# Patient Record
Sex: Male | Born: 1960 | Race: White | Hispanic: No | Smoking: Current every day smoker
Health system: Southern US, Community
[De-identification: ages and names within clinical notes are randomized; demographics above are authoritative.]

## PROBLEM LIST (undated history)

## (undated) DIAGNOSIS — I219 Acute myocardial infarction, unspecified: Secondary | ICD-10-CM

## (undated) DIAGNOSIS — I10 Essential (primary) hypertension: Secondary | ICD-10-CM

## (undated) DIAGNOSIS — G4733 Obstructive sleep apnea (adult) (pediatric): Secondary | ICD-10-CM

## (undated) DIAGNOSIS — G709 Myoneural disorder, unspecified: Secondary | ICD-10-CM

## (undated) DIAGNOSIS — E039 Hypothyroidism, unspecified: Secondary | ICD-10-CM

## (undated) DIAGNOSIS — J302 Other seasonal allergic rhinitis: Secondary | ICD-10-CM

## (undated) DIAGNOSIS — J189 Pneumonia, unspecified organism: Secondary | ICD-10-CM

## (undated) DIAGNOSIS — R011 Cardiac murmur, unspecified: Secondary | ICD-10-CM

## (undated) DIAGNOSIS — M1990 Unspecified osteoarthritis, unspecified site: Secondary | ICD-10-CM

## (undated) DIAGNOSIS — E119 Type 2 diabetes mellitus without complications: Secondary | ICD-10-CM

## (undated) DIAGNOSIS — E7800 Pure hypercholesterolemia, unspecified: Secondary | ICD-10-CM

## (undated) DIAGNOSIS — C801 Malignant (primary) neoplasm, unspecified: Secondary | ICD-10-CM

## (undated) DIAGNOSIS — Z21 Asymptomatic human immunodeficiency virus [HIV] infection status: Secondary | ICD-10-CM

## (undated) DIAGNOSIS — B20 Human immunodeficiency virus [HIV] disease: Secondary | ICD-10-CM

## (undated) DIAGNOSIS — I2510 Atherosclerotic heart disease of native coronary artery without angina pectoris: Secondary | ICD-10-CM

## (undated) HISTORY — PX: KNEE SURGERY: SHX244

## (undated) HISTORY — PX: CARPAL TUNNEL RELEASE: SHX101

## (undated) HISTORY — DX: Obstructive sleep apnea (adult) (pediatric): G47.33

## (undated) HISTORY — DX: Hypothyroidism, unspecified: E03.9

## (undated) HISTORY — PX: UMBILICAL HERNIA REPAIR: SHX196

## (undated) HISTORY — DX: Human immunodeficiency virus (HIV) disease: B20

## (undated) HISTORY — DX: Other seasonal allergic rhinitis: J30.2

## (undated) HISTORY — DX: Essential (primary) hypertension: I10

## (undated) HISTORY — PX: OTHER SURGICAL HISTORY: SHX169

## (undated) HISTORY — DX: Asymptomatic human immunodeficiency virus (hiv) infection status: Z21

## (undated) HISTORY — DX: Pure hypercholesterolemia, unspecified: E78.00

## (undated) HISTORY — PX: BACK SURGERY: SHX140

## (undated) HISTORY — DX: Type 2 diabetes mellitus without complications: E11.9

## (undated) HISTORY — PX: APPENDECTOMY: SHX54

## (undated) HISTORY — DX: Acute myocardial infarction, unspecified: I21.9

## (undated) HISTORY — PX: ROTATOR CUFF REPAIR: SHX139

---

## 2001-03-24 ENCOUNTER — Observation Stay (HOSPITAL_COMMUNITY): Admission: RE | Admit: 2001-03-24 | Discharge: 2001-03-25 | Payer: Self-pay | Admitting: Orthopedic Surgery

## 2001-11-24 ENCOUNTER — Observation Stay (HOSPITAL_COMMUNITY): Admission: RE | Admit: 2001-11-24 | Discharge: 2001-11-25 | Payer: Self-pay | Admitting: Orthopedic Surgery

## 2003-05-05 ENCOUNTER — Emergency Department (HOSPITAL_COMMUNITY): Admission: EM | Admit: 2003-05-05 | Discharge: 2003-05-05 | Payer: Self-pay | Admitting: Emergency Medicine

## 2003-06-27 ENCOUNTER — Ambulatory Visit (HOSPITAL_BASED_OUTPATIENT_CLINIC_OR_DEPARTMENT_OTHER): Admission: RE | Admit: 2003-06-27 | Discharge: 2003-06-27 | Payer: Self-pay | Admitting: Orthopedic Surgery

## 2003-06-27 ENCOUNTER — Ambulatory Visit (HOSPITAL_COMMUNITY): Admission: RE | Admit: 2003-06-27 | Discharge: 2003-06-27 | Payer: Self-pay | Admitting: Orthopedic Surgery

## 2005-11-02 ENCOUNTER — Ambulatory Visit (HOSPITAL_COMMUNITY): Admission: RE | Admit: 2005-11-02 | Discharge: 2005-11-03 | Payer: Self-pay | Admitting: Neurosurgery

## 2009-11-28 DIAGNOSIS — I219 Acute myocardial infarction, unspecified: Secondary | ICD-10-CM

## 2009-11-28 HISTORY — DX: Acute myocardial infarction, unspecified: I21.9

## 2010-07-03 ENCOUNTER — Inpatient Hospital Stay (HOSPITAL_COMMUNITY)
Admission: AD | Admit: 2010-07-03 | Payer: Self-pay | Source: Other Acute Inpatient Hospital | Admitting: Pulmonary Disease

## 2010-07-04 ENCOUNTER — Inpatient Hospital Stay (HOSPITAL_COMMUNITY)
Admission: RE | Admit: 2010-07-04 | Discharge: 2010-07-09 | DRG: 167 | Disposition: A | Discharge status: Home / Self Care | Payer: Managed Care, Other (non HMO) | Source: Other Acute Inpatient Hospital | Attending: Pulmonary Disease | Admitting: Pulmonary Disease

## 2010-07-04 DIAGNOSIS — R918 Other nonspecific abnormal finding of lung field: Secondary | ICD-10-CM

## 2010-07-04 DIAGNOSIS — J8289 Other pulmonary eosinophilia, not elsewhere classified: Principal | ICD-10-CM | POA: Diagnosis present

## 2010-07-04 DIAGNOSIS — J45909 Unspecified asthma, uncomplicated: Secondary | ICD-10-CM | POA: Diagnosis present

## 2010-07-04 DIAGNOSIS — R05 Cough: Secondary | ICD-10-CM

## 2010-07-04 DIAGNOSIS — G4733 Obstructive sleep apnea (adult) (pediatric): Secondary | ICD-10-CM | POA: Diagnosis present

## 2010-07-04 DIAGNOSIS — I2510 Atherosclerotic heart disease of native coronary artery without angina pectoris: Secondary | ICD-10-CM | POA: Diagnosis present

## 2010-07-04 DIAGNOSIS — E119 Type 2 diabetes mellitus without complications: Secondary | ICD-10-CM | POA: Diagnosis present

## 2010-07-04 DIAGNOSIS — I10 Essential (primary) hypertension: Secondary | ICD-10-CM | POA: Diagnosis present

## 2010-07-04 DIAGNOSIS — J679 Hypersensitivity pneumonitis due to unspecified organic dust: Secondary | ICD-10-CM | POA: Diagnosis present

## 2010-07-04 DIAGNOSIS — J9600 Acute respiratory failure, unspecified whether with hypoxia or hypercapnia: Secondary | ICD-10-CM

## 2010-07-04 LAB — CBC
Platelets: 294 10*3/uL (ref 150–400)
RBC: 4.56 MIL/uL (ref 4.22–5.81)

## 2010-07-04 LAB — COMPREHENSIVE METABOLIC PANEL
Albumin: 2.7 g/dL — ABNORMAL LOW (ref 3.5–5.2)
Alkaline Phosphatase: 63 U/L (ref 39–117)
BUN: 10 mg/dL (ref 6–23)
CO2: 28 mEq/L (ref 19–32)
Calcium: 8.7 mg/dL (ref 8.4–10.5)
Chloride: 100 mEq/L (ref 96–112)
Creatinine, Ser: 0.88 mg/dL (ref 0.4–1.5)
GFR calc non Af Amer: >60 mL/min (ref >60)
Glucose, Bld: 189 mg/dL — ABNORMAL HIGH (ref 70–99)
Total Bilirubin: 0.4 mg/dL (ref 0.3–1.2)

## 2010-07-04 LAB — DIFFERENTIAL
Basophils Relative: 0 % (ref 0–1)
Eosinophils Absolute: 2 10*3/uL — ABNORMAL HIGH (ref 0.0–0.7)
Eosinophils Relative: 18 % — ABNORMAL HIGH (ref 0–5)
Lymphocytes Relative: 13 % (ref 12–46)
Lymphs Abs: 1.5 10*3/uL (ref 0.7–4.0)
Monocytes Absolute: 0.6 10*3/uL (ref 0.1–1.0)
Neutro Abs: 7.4 10*3/uL (ref 1.7–7.7)
Neutrophils Relative %: 65 % (ref 43–77)

## 2010-07-04 LAB — PROTIME-INR
INR: 1.11 (ref 0.00–1.49)
Prothrombin Time: 14.5 seconds (ref 11.6–15.2)

## 2010-07-04 LAB — APTT: aPTT: 30 seconds (ref 24–37)

## 2010-07-05 ENCOUNTER — Inpatient Hospital Stay (HOSPITAL_COMMUNITY): Payer: Managed Care, Other (non HMO)

## 2010-07-05 DIAGNOSIS — J679 Hypersensitivity pneumonitis due to unspecified organic dust: Secondary | ICD-10-CM

## 2010-07-05 LAB — SEDIMENTATION RATE: Sed Rate: 41 mm/hr — ABNORMAL HIGH (ref 0–16)

## 2010-07-05 LAB — GLUCOSE, CAPILLARY
Glucose-Capillary: 176 mg/dL — ABNORMAL HIGH (ref 70–99)
Glucose-Capillary: 153 mg/dL — ABNORMAL HIGH (ref 70–99)
Glucose-Capillary: 224 mg/dL — ABNORMAL HIGH (ref 70–99)

## 2010-07-05 LAB — DIFFERENTIAL
Basophils Relative: 1 % (ref 0–1)
Eosinophils Absolute: 2.3 10*3/uL — ABNORMAL HIGH (ref 0.0–0.7)
Eosinophils Relative: 21 % — ABNORMAL HIGH (ref 0–5)
Monocytes Absolute: 0.6 10*3/uL (ref 0.1–1.0)
Monocytes Relative: 5 % (ref 3–12)

## 2010-07-05 LAB — CBC
Hemoglobin: 14.7 g/dL (ref 13.0–17.0)
MCH: 31.1 pg (ref 26.0–34.0)
MCHC: 34.3 g/dL (ref 30.0–36.0)
Platelets: 322 10*3/uL (ref 150–400)

## 2010-07-05 LAB — TSH: TSH: 2.582 u[IU]/mL (ref 0.350–4.500)

## 2010-07-06 LAB — GLUCOSE, CAPILLARY
Glucose-Capillary: 230 mg/dL — ABNORMAL HIGH (ref 70–99)
Glucose-Capillary: 228 mg/dL — ABNORMAL HIGH (ref 70–99)

## 2010-07-07 ENCOUNTER — Inpatient Hospital Stay (HOSPITAL_COMMUNITY): Payer: Managed Care, Other (non HMO)

## 2010-07-07 ENCOUNTER — Other Ambulatory Visit: Payer: Self-pay | Admitting: Pulmonary Disease

## 2010-07-07 DIAGNOSIS — R918 Other nonspecific abnormal finding of lung field: Secondary | ICD-10-CM

## 2010-07-07 DIAGNOSIS — J679 Hypersensitivity pneumonitis due to unspecified organic dust: Secondary | ICD-10-CM

## 2010-07-07 DIAGNOSIS — R05 Cough: Secondary | ICD-10-CM

## 2010-07-07 LAB — BASIC METABOLIC PANEL
CO2: 28 mEq/L (ref 19–32)
Calcium: 9.3 mg/dL (ref 8.4–10.5)
GFR calc Af Amer: >60 mL/min (ref >60)
GFR calc non Af Amer: >60 mL/min (ref >60)
Potassium: 4.4 mEq/L (ref 3.5–5.1)
Sodium: 138 mEq/L (ref 135–145)

## 2010-07-07 LAB — CBC
Hemoglobin: 14.1 g/dL (ref 13.0–17.0)
MCHC: 33.8 g/dL (ref 30.0–36.0)
WBC: 18.7 10*3/uL — ABNORMAL HIGH (ref 4.0–10.5)

## 2010-07-07 LAB — GLUCOSE, CAPILLARY: Glucose-Capillary: 178 mg/dL — ABNORMAL HIGH (ref 70–99)

## 2010-07-08 DIAGNOSIS — J82 Pulmonary eosinophilia, not elsewhere classified: Secondary | ICD-10-CM

## 2010-07-08 DIAGNOSIS — J679 Hypersensitivity pneumonitis due to unspecified organic dust: Secondary | ICD-10-CM

## 2010-07-08 LAB — GLUCOSE, CAPILLARY: Glucose-Capillary: 203 mg/dL — ABNORMAL HIGH (ref 70–99)

## 2010-07-09 ENCOUNTER — Inpatient Hospital Stay (HOSPITAL_COMMUNITY): Payer: Managed Care, Other (non HMO)

## 2010-07-09 LAB — BASIC METABOLIC PANEL
CO2: 29 mEq/L (ref 19–32)
Calcium: 9.2 mg/dL (ref 8.4–10.5)
Creatinine, Ser: 1.01 mg/dL (ref 0.4–1.5)
GFR calc Af Amer: >60 mL/min (ref >60)

## 2010-07-09 LAB — CBC
MCV: 89.8 fL (ref 78.0–100.0)
Platelets: 363 10*3/uL (ref 150–400)
RDW: 12.8 % (ref 11.5–15.5)
WBC: 12.2 10*3/uL — ABNORMAL HIGH (ref 4.0–10.5)

## 2010-07-09 LAB — HISTOPLASMA ANTIBODIES: Mycelia phase ab: <1:8 {titer}

## 2010-07-09 LAB — DIFFERENTIAL
Basophils Absolute: 0 10*3/uL (ref 0.0–0.1)
Basophils Relative: 0 % (ref 0–1)
Eosinophils Absolute: 0 10*3/uL (ref 0.0–0.7)
Eosinophils Relative: 0 % (ref 0–5)

## 2010-07-09 LAB — GLUCOSE, CAPILLARY: Glucose-Capillary: 155 mg/dL — ABNORMAL HIGH (ref 70–99)

## 2010-07-10 LAB — CULTURE, RESPIRATORY W GRAM STAIN

## 2010-07-12 LAB — HYPERSENSITIVITY PNUEMONITIS PROFILE
A fumigatus #1: NOT DETECTED
Acremonium (Cephalosporium): NOT DETECTED
Aspergillus flavus: NOT DETECTED
Faenia retivirgula: NOT DETECTED

## 2010-07-13 LAB — LEGIONELLA PROFILE(CULTURE+DFA/SMEAR): Legionella Antigen (DFA): NEGATIVE

## 2010-07-14 NOTE — Op Note (Signed)
  NAMEANTONEY, Ethan Farrell               ACCOUNT NO.:  [id number]  MEDICAL RECORD NO.:  [id number]           PATIENT TYPE:  I  LOCATION:  5114                         FACILITY:  [facility]  PHYSICIAN:  Oretha Milch, MD      DATE OF BIRTH:  07-17-1960  DATE OF PROCEDURE:  07/07/2010 DATE OF DISCHARGE:                              OPERATIVE REPORT   PROCEDURE PERFORMED:  Video bronchoscopy with biopsies.  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  Written informed consent was obtained from the patient prior to the procedure.  Risks of procedure including coughing, bleeding, and small chance of lung puncture requiring a chest tube were discussed in detail, and evidenced understanding.  Bronchoscope was inserted from the right naris.  Upper airway appeared normal.  There was collapsible above the epiglottis suggestive of sleep apnea physiology.  Vocal cords showed normal appearance and motion.  The tracheobronchial tree was then inspected at subsegmental level.  Minimal white purulent phlegm was noted in the left lower lobe airways.  Lavage was obtained from this site.  Transbronchial biopsies under fluoroscopies were obtained x3 from the left lower lobe.  The patient tolerated well with minimal bleeding and moderate amount of coughing.  Chest x-ray was performed without presence of pneumothorax.     Oretha Milch, MD     RVA/MEDQ  D:  07/07/2010  T:  07/08/2010  Job:  [id number]  Electronically Signed by Cyril Mourning MD on 07/14/2010 08:38:34 AM

## 2010-07-14 NOTE — Discharge Summary (Addendum)
Ethan Farrell, Ethan Farrell               ACCOUNT NO.:  [id number]  MEDICAL RECORD NO.:  [id number]           PATIENT TYPE:  I  LOCATION:  5114                         FACILITY:  [facility]  PHYSICIAN:  Oretha Milch, MD      DATE OF BIRTH:  09/23/1960  DATE OF ADMISSION:  07/04/2010 DATE OF DISCHARGE:  07/09/2010                              DISCHARGE SUMMARY   DISCHARGE DIAGNOSES: 1. Eosinophilia. 2. Bilateral pulmonary infiltrates. 3. Obstructive sleep apnea. 4. Hyperglycemia/diabetes mellitus.  CONSULTANTS:  Dr. Daiva Eves of Infectious Disease.  LABORATORY DATA:  April 7 IgE is 510.9.  April 11; sodium was 137, potassium 4.2, chloride 98, CO2 29, glucose 206, BUN 18, creatinine 1.01 with a calcium of 9.2. April 11; CBC demonstrates WBC 12.2, hemoglobin 14.9, hematocrit 44.0, platelet count 363, neutrophils 84%.  MICRO DATA: 1. April 9, bronchial washings for Legionella is negative on     preliminary results.  Final results pending. 2. Respiratory culture from bronchial washings demonstrates normal     flora with final results pending.  April 9 PCP from bronchial     alveolar lavage is negative. 3. April 9 bronchial washings for AFB are negative on preliminary     results.  Final results pending. 4. April 9 bronchial washings for yeast or fungal elements are     negative.  RADIOLOGIC DATA:  April 7, chest x-ray demonstrates diffuse reticular nodular opacities noted throughout both lungs, relative sparing of the lung periphery.  No other acute infiltrate noted.  April 11 two-view of the chest demonstrates mild improvement and diffuse reticular nodular opacities.  HISTORY OF PRESENT ILLNESS:  Ethan Farrell is a 50 year old white male with past medical history of diabetes, hypertension, coronary artery disease with history of stent placement who was initially admitted to [facility] on March 3 for complaints of increasing shortness of breath, cough, congestion and dyspnea on  exertion.  He also was noted to have fevers and chills.  He recently had visited the [place] for approximately 1 week prior to presentation for a mission trip.  After arriving home, he noted increasing shortness of breath, weakness and malaise, at which time he presented to the [facility] Emergency Room with persistence of those symptoms on March 3.  He was transferred to [facility] on April 6 for further evaluation of hypoxic respiratory failure. At [facility], Ethan Farrell apparently had a negative PPD.  After transfer to [facility], he was evaluated with a hypersensitivity panel and underwent fiberoptic bronchoscopy for evaluation of bilateral pulmonary infiltrates noted on chest x-ray.  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  He was placed on high-dose steroids and to date pulmonary washings/cultures are negative.  Ethan Farrell has a known history of obstructive sleep apnea, was continued on CPAP during hospitalization at night.  He also has a known history of diabetes and was continued on sliding scale insulin during hospitalization to achieve normal glucose control in the setting of steroid use.  Ethan Farrell was evaluated prior  to discharge for oxygen Farrell.  He was noted to have 95% on room air at rest.  With ambulation his oxygen saturations decreased to 89 to 91% with improvement of oxygen saturations 95 to 96% on 2 L.  At this time Ethan Farrell did not qualify for home O2.  He previously was on lisinopril prior to hospital admission and this was discontinued during hospital course secondary to cough and respiratory symptoms.  He will continue on his home metoprolol at the time of discharge.  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  HOSPITAL COURSE BY DISCHARGE DIAGNOSES: 1. Eosinophilia.  As per HPI, Ethan Farrell is a 50 year old male who was     admitted to [facility] on March 3 with increasing cough,     shortness of breath, fatigue/malaise and dyspnea on exertion.  He     was admitted and noted to have bilateral reticular nodular     opacities which spared the lung periphery.  He underwent placement     of PPD and was negative at [facility].  He was transferred     to [facility] on April 6 secondary to hypoxemia for further     pulmonary management.  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  He was placed on high-dose steroids with mild     improvement in plain films of the chest and significant improvement     in his respiratory symptoms.  Please see discharge medication     reconciliation for medications. 2. Bilateral pulmonary infiltrates.  Please see above for details. 3. Obstructive sleep apnea, Ethan Farrell does have a known history of     obstructive sleep apnea and was continued on nocturnal CPAP during    hospital course.  He will continue on his previous home setting of     CPAP at the time of discharge. 4. Hyperglycemia/diabetes mellitus.  In the setting of steroid use,     Ethan Farrell was placed on sliding scale insulin during hospital     course to achieve normal glucose control and will be discharged on     his previous home regimen with expected reduction and glucose as     steroids are tapered.  He will be placed on a slow steroid taper     and follow up in the office as below.  DISCHARGE INSTRUCTIONS: 1. Activity as tolerated.  He has been instructed to increase his     activity as slowly and as tolerated. 2. Diet, diabetic diet. 3. Followup appointments.  He is scheduled to follow up with Rubye Oaks at [facility] on April 26 at  11:00 a.m. with a chest     x-ray prior to office visit, also scheduled to follow up with Dr.     Vassie Loll on Thursday May 10 at 9:30 a.m.  DISCHARGE MEDICATIONS: 1. Cymbalta 30 mg by mouth daily. 2. Mucinex DM XR 2 tablets by mouth twice daily. 3. Prednisone 10 mg tabs 4 tablets daily for 7 days, 3 tablets daily     for 7 days, and 2 tablets daily for 7 days, then 1 tablet daily for     7 days and stop. 4. Ambien 5 mg tabs 5-10 mg by mouth daily at bedtime as needed. 5. Effient 10 mg 1 tablet by  mouth daily. 6. Fish oil 1000 mg 1 capsule by mouth daily. 7. Kombiglyze XR 2.07/998 1 tablet by mouth daily. 8. Lipitor 80 mg 1 tablet by mouth daily. 9. Metoprolol 50 mg 1 tablet by mouth twice daily. 10.Multivitamin 1 tablet by mouth daily. 11.Synthroid 50 mcg 1 tablet by mouth daily.  The patient is instructed to stop taking the following medications. 1. Lisinopril 10 mg daily.  DISPOSITION AT TIME OF DISCHARGE:  Mr. Tortorella has met maximum benefit of inpatient therapy and is currently medically stable and cleared for discharge pending followup as above.   Time spent on disposition greater than 35 minutes.     Canary Brim, NP   ______________________________ Oretha Milch, MD    BO/MEDQ  D:  07/09/2010  T:  07/10/2010  Job:  [id number]  cc:   Donnel Saxon  Electronically Signed by Cyril Mourning MD on 07/14/2010 08:38:46 AM Electronically Signed by Canary Brim  on 07/14/2010 04:40:39 PM

## 2010-07-22 ENCOUNTER — Encounter: Payer: Self-pay | Admitting: Adult Health

## 2010-07-24 ENCOUNTER — Ambulatory Visit (INDEPENDENT_AMBULATORY_CARE_PROVIDER_SITE_OTHER): Payer: Managed Care, Other (non HMO) | Admitting: Adult Health

## 2010-07-24 ENCOUNTER — Ambulatory Visit (INDEPENDENT_AMBULATORY_CARE_PROVIDER_SITE_OTHER)
Admission: RE | Admit: 2010-07-24 | Discharge: 2010-07-24 | Disposition: A | Discharge status: Home / Self Care | Payer: Managed Care, Other (non HMO) | Source: Ambulatory Visit | Attending: Adult Health | Admitting: Adult Health

## 2010-07-24 ENCOUNTER — Encounter: Payer: Self-pay | Admitting: Adult Health

## 2010-07-24 VITALS — BP 118/76 | HR 87 | Temp 98.9°F | Ht 73.0 in | Wt 285.4 lb

## 2010-07-24 DIAGNOSIS — R918 Other nonspecific abnormal finding of lung field: Secondary | ICD-10-CM | POA: Insufficient documentation

## 2010-07-24 DIAGNOSIS — Z9989 Dependence on other enabling machines and devices: Secondary | ICD-10-CM | POA: Insufficient documentation

## 2010-07-24 DIAGNOSIS — G4733 Obstructive sleep apnea (adult) (pediatric): Secondary | ICD-10-CM

## 2010-07-24 DIAGNOSIS — J679 Hypersensitivity pneumonitis due to unspecified organic dust: Secondary | ICD-10-CM

## 2010-07-24 NOTE — Patient Instructions (Signed)
Taper off prednisone as planned  .May use Mucinex DM Twice daily  As needed  Cough follow up in 2 weeks as planned with Dr. Vassie Loll  And As needed

## 2010-07-24 NOTE — Assessment & Plan Note (Signed)
Cont on nocturnal cpap

## 2010-07-24 NOTE — Progress Notes (Signed)
  Subjective:    Patient ID: Ethan Farrell, male    DOB: [birth year], 50 y.o.   MRN: [id number]  HPI 50 yo male seen for initial pulmonary consult in hospital 07/04/10 for bilateral pulmonary infiltrates, OSA and Eosinophilia.   07/24/2010 Post Hospital  Pt presents for hospital follow up. Admitted 4/6-4/11/12 for bilateral pulmonary infiltrates, OSA and Eosinophilia. Pt initially admitted to [facility] on 07/01/10 for cough, dyspnea, hypoxia ,  and bilateral reticular nodular opacities. He was transferred to [facility] cone with  Pulmonary acceptance. Pt underwent FOB on 07/04/10 . 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 CXR with substantial improvement on steroids. He does have known OSA and CPAP was continued. FOB showed neg legionella, Prelim. AFB neg, and yeast/fungal neg. He had neg PPD. He had recently been on mission trip to [place].  His ACE inhibitor was stopped during stay due to cough.   Discharged on steroid taper over 4 weeks. He had minimal desats with walking and did not require home O2.   Since discharge he says he is feeling much better with decreased cough and dyspnea. Has tapered steroids to 20mg  daily .  Xray today shows no change in bilateral pulmonary infiltrates since 4/11 (but improved from 07/05/10).   He has follow up in [facility] [facility] for upcoming sleep study.         Review of Systems Constitutional:   No  weight loss, night sweats,  Fevers, chills, fatigue, or  lassitude.  HEENT:   No headaches,  Difficulty swallowing,  Tooth/dental problems, or  Sore throat,                No sneezing, itching, ear ache, nasal congestion, post nasal drip,   CV:  No chest pain,  Orthopnea, PND, swelling in lower extremities, anasarca, dizziness, palpitations, syncope.   GI  No heartburn, indigestion, abdominal pain, nausea, vomiting, diarrhea, change in bowel habits, loss of appetite, bloody stools.    Resp: No shortness of breath with exertion or at rest.  No excess mucus, no productive cough,  No non-productive cough,  No coughing up of blood.  No change in color of mucus.  No wheezing.  No chest wall deformity  Skin: no rash or lesions.  GU: no dysuria, change in color of urine, no urgency or frequency.  No flank pain, no hematuria   MS:  No joint pain or swelling.  No decreased range of motion.  No back pain.  Psych:  No change in mood or affect. No depression or anxiety.  No memory loss.          Objective:   Physical Exam GEN: A/Ox3; pleasant , NAD, well nourished , very tan  HEENT:  [place]/AT,  EACs-clear, TMs-wnl, NOSE-clear, THROAT-clear, no lesions, no postnasal drip or exudate noted.   NECK:  Supple w/ fair ROM; no JVD; normal carotid impulses w/o bruits; no thyromegaly or nodules palpated; no lymphadenopathy.  RESP  Coarse BS w/ no wheezing  CARD:  RRR, no m/r/g  , no peripheral edema, pulses intact, no cyanosis or clubbing.  GI:   Soft & nt; nml bowel sounds; no organomegaly or masses detected.  Musco: Warm bil, no deformities or joint swelling noted.   Neuro: alert, no focal deficits noted.    Skin: Warm, no lesions or rashes          Assessment & Plan:

## 2010-08-07 ENCOUNTER — Ambulatory Visit (INDEPENDENT_AMBULATORY_CARE_PROVIDER_SITE_OTHER)
Admission: RE | Admit: 2010-08-07 | Discharge: 2010-08-07 | Disposition: A | Discharge status: Home / Self Care | Payer: Managed Care, Other (non HMO) | Source: Ambulatory Visit | Attending: Pulmonary Disease | Admitting: Pulmonary Disease

## 2010-08-07 ENCOUNTER — Encounter: Payer: Self-pay | Admitting: Pulmonary Disease

## 2010-08-07 ENCOUNTER — Ambulatory Visit (INDEPENDENT_AMBULATORY_CARE_PROVIDER_SITE_OTHER): Payer: Managed Care, Other (non HMO) | Admitting: Pulmonary Disease

## 2010-08-07 VITALS — BP 126/68 | HR 88 | Temp 98.0°F | Ht 73.0 in | Wt 266.8 lb

## 2010-08-07 DIAGNOSIS — J679 Hypersensitivity pneumonitis due to unspecified organic dust: Secondary | ICD-10-CM

## 2010-08-07 DIAGNOSIS — G4733 Obstructive sleep apnea (adult) (pediatric): Secondary | ICD-10-CM

## 2010-08-07 NOTE — Progress Notes (Signed)
  Subjective:    Patient ID: Ethan Farrell, male    DOB: April 16, 1960, 50 y.o.   MRN: [id number]  HPI  49/M, remote smoker (quit '92) seen for initial pulmonary consult in hospital 07/04/10 for bilateral pulmonary infiltrates, OSA and Eosinophilia.   Admitted 4/6-4/11/12 for bilateral pulmonary infiltrates, OSA and Eosinophilia following a  mission trip to [place].Marland Kitchen Pt initially admitted to [facility] on 07/01/10 for cough, dyspnea, hypoxia , and bilateral reticular nodular opacities.Pt underwent FOB on 07/04/10 . 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 CXR with substantial improvement on steroids. He does have known OSA and CPAP was continued.  FOB showed neg legionella, Prelim. AFB neg, and yeast/fungal neg. He had neg PPD.   His ACE inhibitor was stopped during stay due to cough.  Discharged on steroid taper over 4 weeks. He had minimal desats with walking and did not require home O2.     08/07/2010 Since discharge he says he is feeling much better with decreased cough and dyspnea.Is off prednisone - steroids made him itch. He ahs lost significant weight.  Xray today shows no change in bilateral pulmonary infiltrates since 4/11 (but improved from 07/05/10).  He has follow up in [facility] for sleep study.     Review of Systems  Constitutional: Negative for fever, appetite change and unexpected weight change.  HENT: Positive for congestion. Negative for ear pain, sore throat, rhinorrhea, sneezing, trouble swallowing, dental problem and postnasal drip.   Eyes: Negative for redness.  Respiratory: Positive for cough, shortness of breath and wheezing.   Cardiovascular: Negative for chest pain, palpitations and leg swelling.  Gastrointestinal: Negative for nausea, vomiting, abdominal pain and diarrhea.  Genitourinary: Negative for dysuria and urgency.  Musculoskeletal: Negative for joint swelling.  Skin: Negative  for rash.  Neurological: Negative for syncope and headaches.  Hematological: Does not bruise/bleed easily.  Psychiatric/Behavioral: Negative for dysphoric mood. The patient is not nervous/anxious.        Objective:   Physical Exam    Gen. Pleasant, well-nourished, in no distress ENT - no lesions, no post nasal drip Neck: No JVD, no thyromegaly, no carotid bruits Lungs: no use of accessory muscles, no dullness to percussion, clear without rales or rhonchi  Cardiovascular: Rhythm regular, heart sounds  normal, no murmurs or gallops, no peripheral edema Musculoskeletal: No deformities, no cyanosis or clubbing      Assessment & Plan:

## 2010-08-07 NOTE — Assessment & Plan Note (Addendum)
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  Xray today >>  less prominent interstitial markings compared to hospital film WIll need FU of infiltrates to resolution - FU CXR in 1-2 months or if symptoms worsen

## 2010-08-07 NOTE — Patient Instructions (Signed)
Chest xray today Your lungs seem to be improving as expected

## 2010-08-07 NOTE — Assessment & Plan Note (Signed)
Rpt sleep study done at [facility] He has FU with dr Blenda Nicely

## 2010-08-18 ENCOUNTER — Other Ambulatory Visit: Payer: Self-pay | Admitting: Pulmonary Disease

## 2010-08-18 DIAGNOSIS — J679 Hypersensitivity pneumonitis due to unspecified organic dust: Secondary | ICD-10-CM

## 2010-08-18 NOTE — Progress Notes (Signed)
Quick Note:  I informed pt of RA's findings and recommendations. Pt verbalized understanding  ______ 

## 2010-08-19 LAB — AFB CULTURE WITH SMEAR (NOT AT ARMC): Acid Fast Smear: NONE SEEN

## 2011-08-20 IMAGING — CR DG CHEST 2V
2 series · 2 of 2 positions shown · non-contrast
Comparison: 07/09/2010, 07/07/2010, 07/05/2010 and 10/27/2005

CLINICAL DATA: Cough, shortness of breath.

CHEST - 2 VIEW

[view not recorded (1 of 2)]
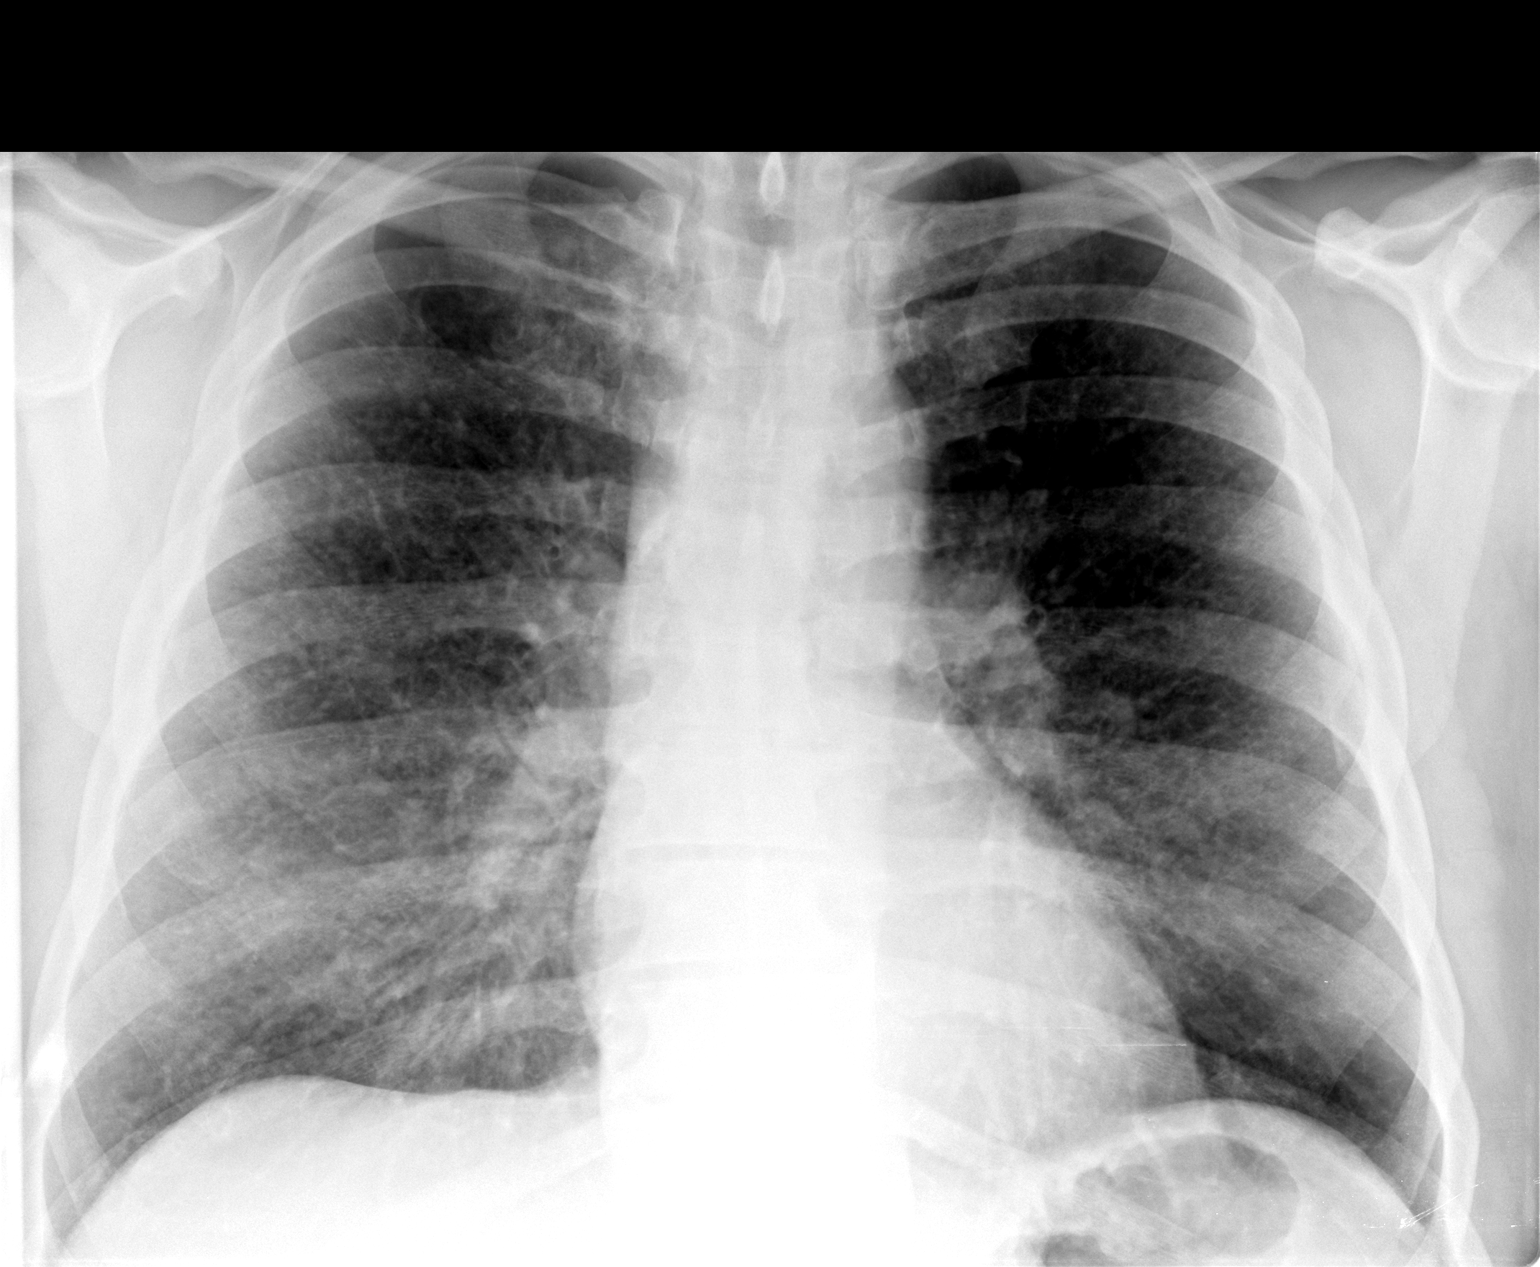

[view not recorded (2 of 2)]
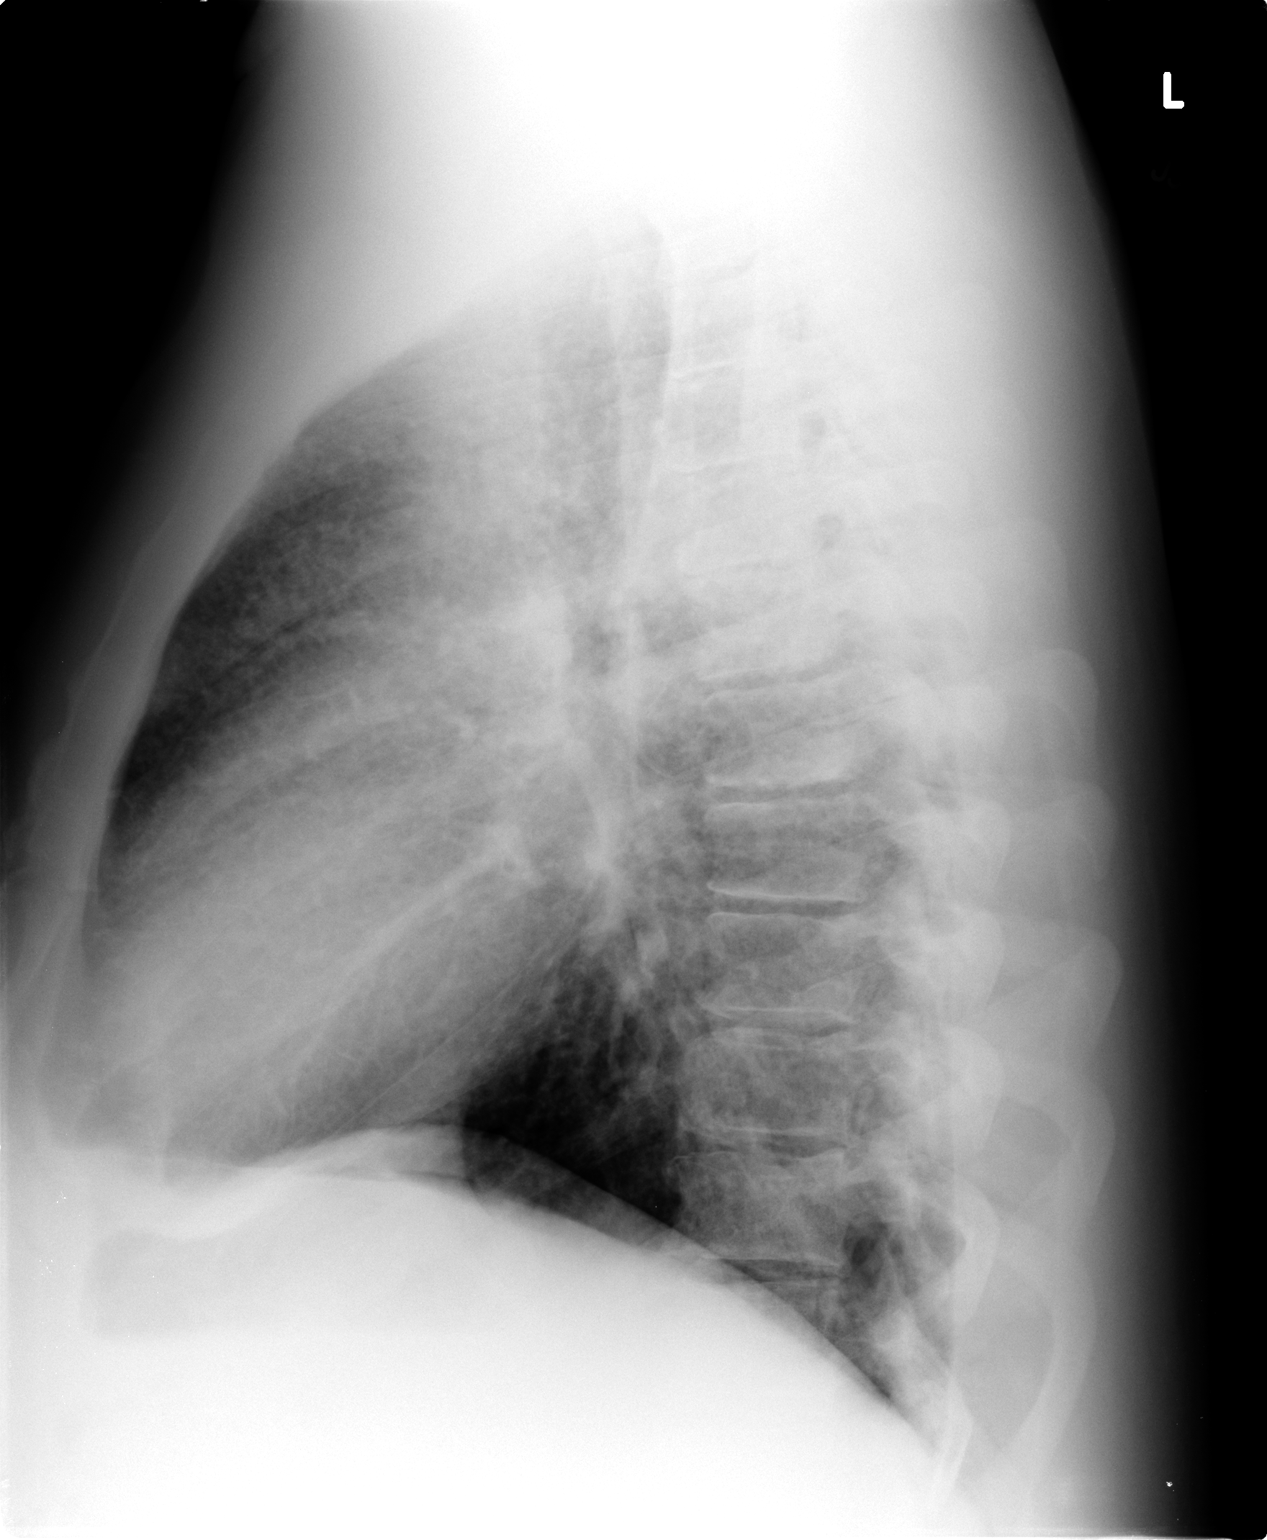

[2 of 2 positions shown; findings below may reference images not displayed]

FINDINGS: Trachea is midline.  Heart size normal.  Diffuse
micronodular pattern persists.  No pleural fluid.
IMPRESSION: Diffuse micronodular pattern throughout both lungs is unchanged
from 07/09/2010, but appears improved from 07/05/2010.  Improving
atypical or viral pneumonia is considered.  If further evaluation
is desired, chest CT without contrast would be helpful.

## 2018-09-14 ENCOUNTER — Ambulatory Visit: Payer: 59

## 2018-09-14 ENCOUNTER — Other Ambulatory Visit: Payer: Self-pay

## 2018-09-14 ENCOUNTER — Other Ambulatory Visit: Payer: 59

## 2018-09-14 DIAGNOSIS — B20 Human immunodeficiency virus [HIV] disease: Secondary | ICD-10-CM

## 2018-09-14 DIAGNOSIS — Z79899 Other long term (current) drug therapy: Secondary | ICD-10-CM

## 2018-09-14 DIAGNOSIS — Z113 Encounter for screening for infections with a predominantly sexual mode of transmission: Secondary | ICD-10-CM

## 2018-09-15 LAB — T-HELPER CELL (CD4) - (RCID CLINIC ONLY)
CD4 % Helper T Cell: 34 % (ref 33–65)
CD4 T Cell Abs: 568 /uL (ref 400–1790)

## 2018-09-24 LAB — LIPID PANEL
Cholesterol: 169 mg/dL (ref <200)
HDL: 32 mg/dL — ABNORMAL LOW (ref >=40)
LDL Cholesterol (Calc): 113 mg/dL (calc) — ABNORMAL HIGH
Non-HDL Cholesterol (Calc): 137 mg/dL (calc) — ABNORMAL HIGH (ref <130)
Total CHOL/HDL Ratio: 5.3 (calc) — ABNORMAL HIGH (ref <5.0)
Triglycerides: 129 mg/dL (ref <150)

## 2018-09-24 LAB — COMPLETE METABOLIC PANEL WITH GFR
AG Ratio: 1.5 (calc) (ref 1.0–2.5)
ALT: 14 U/L (ref 9–46)
AST: 17 U/L (ref 10–35)
Albumin: 4.3 g/dL (ref 3.6–5.1)
Alkaline phosphatase (APISO): 78 U/L (ref 35–144)
BUN: 25 mg/dL (ref 7–25)
CO2: 23 mmol/L (ref 20–32)
Calcium: 9.5 mg/dL (ref 8.6–10.3)
Chloride: 105 mmol/L (ref 98–110)
Creat: 0.82 mg/dL (ref 0.70–1.33)
GFR, Est African American: 112 mL/min/{1.73_m2} (ref >=60)
GFR, Est Non African American: 97 mL/min/{1.73_m2} (ref >=60)
Globulin: 2.9 g/dL (calc) (ref 1.9–3.7)
Glucose, Bld: 97 mg/dL (ref 65–99)
Potassium: 4.3 mmol/L (ref 3.5–5.3)
Sodium: 139 mmol/L (ref 135–146)
Total Bilirubin: 0.5 mg/dL (ref 0.2–1.2)
Total Protein: 7.2 g/dL (ref 6.1–8.1)

## 2018-09-24 LAB — CBC WITH DIFFERENTIAL/PLATELET
Absolute Monocytes: 418 cells/uL (ref 200–950)
Basophils Absolute: 39 cells/uL (ref 0–200)
Basophils Relative: 0.7 %
Eosinophils Absolute: 330 cells/uL (ref 15–500)
Eosinophils Relative: 6 %
HCT: 43.3 % (ref 38.5–50.0)
Hemoglobin: 15 g/dL (ref 13.2–17.1)
Lymphs Abs: 1650 cells/uL (ref 850–3900)
MCH: 32.5 pg (ref 27.0–33.0)
MCHC: 34.6 g/dL (ref 32.0–36.0)
MCV: 93.9 fL (ref 80.0–100.0)
MPV: 9.8 fL (ref 7.5–12.5)
Monocytes Relative: 7.6 %
Neutro Abs: 3064 cells/uL (ref 1500–7800)
Neutrophils Relative %: 55.7 %
Platelets: 186 10*3/uL (ref 140–400)
RBC: 4.61 10*6/uL (ref 4.20–5.80)
RDW: 13.7 % (ref 11.0–15.0)
Total Lymphocyte: 30 %
WBC: 5.5 10*3/uL (ref 3.8–10.8)

## 2018-09-24 LAB — QUANTIFERON-TB GOLD PLUS
Mitogen-NIL: >10 IU/mL
NIL: 0.02 IU/mL
QuantiFERON-TB Gold Plus: NEGATIVE
TB1-NIL: <0 IU/mL
TB2-NIL: 0 IU/mL

## 2018-09-24 LAB — HEPATITIS C ANTIBODY
Hepatitis C Ab: NONREACTIVE
SIGNAL TO CUT-OFF: 0.08 (ref <1.00)

## 2018-09-24 LAB — HEPATITIS A ANTIBODY, TOTAL: Hepatitis A AB,Total: NONREACTIVE

## 2018-09-24 LAB — RPR: RPR Ser Ql: NONREACTIVE

## 2018-09-24 LAB — HEPATITIS B SURFACE ANTIGEN: Hepatitis B Surface Ag: NONREACTIVE

## 2018-09-24 LAB — HIV-1/2 AB - DIFFERENTIATION
HIV-1 antibody: POSITIVE — AB
HIV-2 Ab: NEGATIVE

## 2018-09-24 LAB — HEPATITIS B CORE ANTIBODY, TOTAL: Hep B Core Total Ab: NONREACTIVE

## 2018-09-24 LAB — HIV-1 RNA ULTRAQUANT REFLEX TO GENTYP+
HIV 1 RNA Quant: 239 copies/mL — ABNORMAL HIGH
HIV-1 RNA Quant, Log: 2.38 Log copies/mL — ABNORMAL HIGH

## 2018-09-24 LAB — HIV ANTIBODY (ROUTINE TESTING W REFLEX): HIV 1&2 Ab, 4th Generation: REACTIVE — AB

## 2018-09-24 LAB — HLA B*5701: HLA-B*5701 w/rflx HLA-B High: NEGATIVE

## 2018-09-24 LAB — HEPATITIS B SURFACE ANTIBODY,QUALITATIVE: Hep B S Ab: NONREACTIVE

## 2018-10-04 ENCOUNTER — Telehealth: Payer: Self-pay | Admitting: Pharmacy Technician

## 2018-10-04 ENCOUNTER — Ambulatory Visit (INDEPENDENT_AMBULATORY_CARE_PROVIDER_SITE_OTHER): Payer: 59 | Admitting: Family

## 2018-10-04 ENCOUNTER — Encounter: Payer: Self-pay | Admitting: Family

## 2018-10-04 ENCOUNTER — Ambulatory Visit (INDEPENDENT_AMBULATORY_CARE_PROVIDER_SITE_OTHER): Payer: 59 | Admitting: Pharmacist

## 2018-10-04 ENCOUNTER — Other Ambulatory Visit: Payer: Self-pay

## 2018-10-04 VITALS — BP 158/73 | HR 67 | Temp 97.8°F | Wt 243.0 lb

## 2018-10-04 DIAGNOSIS — B20 Human immunodeficiency virus [HIV] disease: Secondary | ICD-10-CM | POA: Diagnosis not present

## 2018-10-04 DIAGNOSIS — E039 Hypothyroidism, unspecified: Secondary | ICD-10-CM | POA: Diagnosis not present

## 2018-10-04 DIAGNOSIS — Z21 Asymptomatic human immunodeficiency virus [HIV] infection status: Secondary | ICD-10-CM | POA: Insufficient documentation

## 2018-10-04 DIAGNOSIS — E119 Type 2 diabetes mellitus without complications: Secondary | ICD-10-CM | POA: Diagnosis not present

## 2018-10-04 DIAGNOSIS — I10 Essential (primary) hypertension: Secondary | ICD-10-CM

## 2018-10-04 MED ORDER — BIKTARVY 50-200-25 MG PO TABS: 1.0000 | ORAL_TABLET | Freq: Every day | ORAL | 2 refills | Status: DC

## 2018-10-04 NOTE — Assessment & Plan Note (Signed)
Discussed importance of maintaining good blood sugar control through lifestyle management and medication as needed.  HIV disease increases risk for cardiovascular and renal disease in the future when combined with diabetes.  Continue management per primary care.

## 2018-10-04 NOTE — Patient Instructions (Signed)
Nice to meet you!  Please continue to take your [place] as prescribed daily.  We will send refills into your pharmacy.  Please feel free to ask any questions - we are here to help!  Plan for follow up in 1 month or sooner if needed. We will do your blood work at that appointment.   Have a great day and stay safe!

## 2018-10-04 NOTE — Progress Notes (Signed)
Subjective:    Patient ID: Ethan Farrell, male    DOB: 09-11-1960, 58 y.o.   MRN: [id number]  Chief Complaint  Patient presents with  . HIV Positive/AIDS    HPI:  Ethan Farrell is a 58 y.o. male with previous history of asthma, hypertension, type 2 diabetes, and sleep apnea presenting today to establish care for newly diagnosed HIV disease.  Ethan Farrell was initially diagnosed with HIV in June 2020 when he tested positive during a screening for PrEP.  Antibodies were positive followed by confirmatory HIV-1 testing.  He was started on Biktarvy at the time.  Unclear as to initial CD4 nadir or viral load.  Risk factors for acquiring HIV include MSM.  He has been anxious since diagnosis and is not overly clear as to what to expect with HIV.  Ethan Farrell has been taking his Biktarvy as prescribed with no adverse side effects or missed doses since initiating treatment.  He is feeling anxiety but overall well today. Denies fevers, chills, night sweats, headaches, changes in vision, neck pain/stiffness, nausea, diarrhea, vomiting, lesions or rashes.  Ethan Farrell initial clinic blood work completed on 09/14/2018 with viral load of 239 and CD4 count of 568.  RPR negative for syphilis.  QuantiFERON gold and [id number] negative.  No acute infections with hepatitis A, B, or C.  He is not immune to hepatitis B.  Kidney function, liver function, and electrolytes within normal ranges.  HDL cholesterol of 32, LDL 113, and triglycerides 129.  Ethan Farrell is covered through [place] healthcare and has had no problem obtaining his Biktarvy from the pharmacy today.  He has been slightly depressed recently given his new diagnosis, however he is feeling okay today.  Denies recreational or illicit drug use or alcohol consumption.  He is a current some day smoker.  He works full-time and has stable housing.  No previous history of mental health illness.   Allergies  Allergen Reactions  . Prednisone     Itching,  flushing  . Sulfa Antibiotics     Childhood > rash      Outpatient Medications Prior to Visit  Medication Sig Dispense Refill  . atorvastatin (LIPITOR) 80 MG tablet 1 tab by mouth at bedtime    . Canagliflozin-metFORMIN HCl (INVOKAMET) [phone] MG TABS invokamet    tab [phone]    . CIALIS 20 MG tablet Take 1 tablet by mouth as directed.    Marland Kitchen dextromethorphan-guaiFENesin (MUCINEX DM) 30-600 MG per 12 hr tablet Take 1 tablet by mouth every 12 (twelve) hours as needed.     . DULoxetine (CYMBALTA) 30 MG capsule Take 30 mg by mouth daily.      Marland Kitchen EFFIENT 10 MG TABS Take 1 tablet by mouth daily.    . fish oil-omega-3 fatty acids 1000 MG capsule Take 1 g by mouth daily.     Marland Kitchen KOMBIGLYZE XR 2.07-998 MG TB24 Take 1 tablet by mouth daily.    Marland Kitchen levothyroxine (SYNTHROID) 125 MCG tablet levothyroxine sodium  125 mcg tabs    . liraglutide (VICTOZA) 18 MG/3ML SOPN victoza  18 mg/35m sopn    . lisinopril (ZESTRIL) 10 MG tablet lisinopril  10 mg tabs    . metoprolol (LOPRESSOR) 50 MG tablet Take 1 tablet by mouth Twice daily.    . Multiple Vitamin (MULTIVITAMIN) tablet Take 1 tablet by mouth daily.      .Marland KitchenSYNTHROID 50 MCG tablet 1 tab by mouth once daily    . zolpidem (AMBIEN)  5 MG tablet Take 5 mg by mouth at bedtime as needed.      . bictegravir-emtricitabine-tenofovir AF (BIKTARVY) 50-200-25 MG TABS tablet Take 1 tablet by mouth daily.     No facility-administered medications prior to visit.      Past Medical History:  Diagnosis Date  . Asthma   . DM type 2 (diabetes mellitus, type 2) ([place])   . Heart attack ([place]) 11/2009   2 stents  . HIV infection ([place])   . HTN (hypertension)   . Hypercholesterolemia   . OSA (obstructive sleep apnea)   . Seasonal allergies       Past Surgical History:  Procedure Laterality Date  . BACK SURGERY    . KNEE SURGERY     left  . right shoulder     right shoulder spurs  . ROTATOR CUFF REPAIR     right  . UMBILICAL HERNIA REPAIR        Family  History  Problem Relation Age of Onset  . Hypertension Father   . Heart attack Paternal Grandfather   . Heart failure Paternal Grandfather   . Heart failure Paternal Grandmother   . Heart attack Paternal Grandmother       Social History   Socioeconomic History  . Marital status: Divorced    Spouse name: Not on file  . Number of children: 2  . Years of education: 44  . Highest education level: Not on file  Occupational History  . Occupation: Freight forwarder at [employer]  . Financial resource strain: Not on file  . Food insecurity    Worry: Not on file    Inability: Not on file  . Transportation needs    Medical: Not on file    Non-medical: Not on file  Tobacco Use  . Smoking status: Current Some Day Smoker    Packs/day: 2.00    Years: 15.00    Pack years: 30.00    Types: Cigarettes    Last attempt to quit: 03/30/1990    Years since quitting: 28.5  . Smokeless tobacco: Never Used  Substance and Sexual Activity  . Alcohol use: No  . Drug use: No  . Sexual activity: Not on file  Lifestyle  . Physical activity    Days per week: Not on file    Minutes per session: Not on file  . Stress: Not on file  Relationships  . Social Herbalist on phone: Not on file    Gets together: Not on file    Attends religious service: Not on file    Active member of club or organization: Not on file    Attends meetings of clubs or organizations: Not on file    Relationship status: Not on file  . Intimate partner violence    Fear of current or ex partner: Not on file    Emotionally abused: Not on file    Physically abused: Not on file    Forced sexual activity: Not on file  Other Topics Concern  . Not on file  Social History Narrative  . Not on file      Review of Systems  Constitutional: Negative for appetite change, chills, fatigue, fever and unexpected weight change.  Eyes: Negative for visual disturbance.  Respiratory: Negative for cough, chest  tightness, shortness of breath and wheezing.   Cardiovascular: Negative for chest pain and leg swelling.  Gastrointestinal: Negative for abdominal pain, constipation, diarrhea, nausea and vomiting.  Genitourinary: Negative  for dysuria, flank pain, frequency, genital sores, hematuria and urgency.  Skin: Negative for rash.  Allergic/Immunologic: Negative for immunocompromised state.  Neurological: Negative for dizziness and headaches.       Objective:    BP (!) 158/73   Pulse 67   Temp 97.8 F (36.6 C)   Wt 243 lb (110.2 kg)   BMI 32.06 kg/m  Nursing note and vital signs reviewed.  Physical Exam Constitutional:      General: He is not in acute distress.    Appearance: He is well-developed. He is obese.  Eyes:     Conjunctiva/sclera: Conjunctivae normal.  Neck:     Musculoskeletal: Neck supple.  Cardiovascular:     Rate and Rhythm: Normal rate and regular rhythm.     Heart sounds: Normal heart sounds. No murmur. No friction rub. No gallop.   Pulmonary:     Effort: Pulmonary effort is normal. No respiratory distress.     Breath sounds: Normal breath sounds. No wheezing or rales.  Chest:     Chest wall: No tenderness.  Abdominal:     General: Bowel sounds are normal.     Palpations: Abdomen is soft.     Tenderness: There is no abdominal tenderness.  Lymphadenopathy:     Cervical: No cervical adenopathy.  Skin:    General: Skin is warm and dry.     Findings: No rash.  Neurological:     Mental Status: He is alert and oriented to person, place, and time.  Psychiatric:        Mood and Affect: Mood is anxious.        Behavior: Behavior normal.        Thought Content: Thought content normal.        Judgment: Judgment normal.         Assessment & Plan:   Problem List Items Addressed This Visit      Cardiovascular and Mediastinum   Hypertension    Blood pressure slightly elevated above goal 140/90 today.  No changes in vision, blurred vision or headaches.   Encouraged to monitor blood pressure at home.  Continue current dose of metoprolol and lisinopril with management per primary care.       Relevant Medications   lisinopril (ZESTRIL) 10 MG tablet     Endocrine   Type 2 diabetes mellitus without complications ([place])    Discussed importance of maintaining good blood sugar control through lifestyle management and medication as needed.  HIV disease increases risk for cardiovascular and renal disease in the future when combined with diabetes.  Continue management per primary care.      Relevant Medications   liraglutide (VICTOZA) 18 MG/3ML SOPN   lisinopril (ZESTRIL) 10 MG tablet   Canagliflozin-metFORMIN HCl (INVOKAMET) [phone] MG TABS   Hypothyroidism   Relevant Medications   levothyroxine (SYNTHROID) 125 MCG tablet     Other   HIV disease ([place]) - Primary    Mr. Ethan Farrell has likely improved viral load with 2 weeks of Biktarvy showing a viral load of 239.  Unclear CD4 nadir/viral load.  He has no signs/symptoms of opportunistic infection.  We discussed the pathogenesis, risks of progression if left untreated, transmission, protection, and treatment for HIV disease with time allotted for questions.  He has Pharmacist, community and should have no problems obtaining his medication.  Met with pharmacy staff and clinic introduction provided.  Continue current dose of Biktarvy.  Plan for follow-up in 1 month or sooner if needed.  Relevant Medications   bictegravir-emtricitabine-tenofovir AF (BIKTARVY) 50-200-25 MG TABS tablet       I am having Ethan Farrell maintain his atorvastatin, Synthroid, metoprolol tartrate, Effient, Kombiglyze XR, Cialis, dextromethorphan-guaiFENesin, zolpidem, fish oil-omega-3 fatty acids, multivitamin, DULoxetine, Victoza, levothyroxine, lisinopril, Invokamet, and Biktarvy.   Meds ordered this encounter  Medications  . bictegravir-emtricitabine-tenofovir AF (BIKTARVY) 50-200-25 MG TABS tablet    Sig: Take 1  tablet by mouth daily.    Dispense:  30 tablet    Refill:  2    Order Specific Question:   Supervising Provider    Answer:   Carlyle Basques [[id number]]     Follow-up: Return in about 1 month (around 11/04/2018), or if symptoms worsen or fail to improve.    Terri Piedra, MSN, FNP-C Nurse Practitioner [facility] for Infectious Disease [facility] Office phone: [phone] Pager: [place] number: [phone]

## 2018-10-04 NOTE — Telephone Encounter (Signed)
[facility] Patient Advocate Encounter    Findings of the benefits investigation conducted this morning via test claims for the patient's upcoming appointment on 10/04/2018 are as follows:   Insurance: [employer]- active  Test run with drug historically used, will run another test claim if new drug prescribed [employer])  Insurance claim indicates filled at another pharmacy 10/03/18 and not refillable until 10/26/18. Biktarvy considered non-formulary and preferred are Dovato, Juluca and Triumeq

## 2018-10-04 NOTE — Progress Notes (Signed)
HPI: Ethan Farrell is a 58 y.o. male who presents to the [facility] clinic today to initiate care with Tammy SoursGreg for his newly diagnosed HIV infection.  Patient Active Problem List   Diagnosis Date Noted  . HIV disease (H[facility]) 10/04/2018  . Bilateral pulmonary infiltrates on chest x-ray 07/24/2010  . Pneumonitis, hypersensitivity (H[facility]) 07/24/2010  . OSA on CPAP 07/24/2010    Patient's Medications  New Prescriptions   No medications on file  Previous Medications   ATORVASTATIN (LIPITOR) 80 MG TABLET    1 tab by mouth at bedtime   BICTEGRAVIR-EMTRICITABINE-TENOFOVIR AF (BIKTARVY) 50-200-25 MG TABS TABLET    Take 1 tablet by mouth daily.   CANAGLIFLOZIN-METFORMIN HCL (INVOKAMET) [phone] MG TABS    invokamet    tab [phone]   CIALIS 20 MG TABLET    Take 1 tablet by mouth as directed.   DEXTROMETHORPHAN-GUAIFENESIN (MUCINEX DM) 30-600 MG PER 12 HR TABLET    Take 1 tablet by mouth every 12 (twelve) hours as needed.    DULOXETINE (CYMBALTA) 30 MG CAPSULE    Take 30 mg by mouth daily.     EFFIENT 10 MG TABS    Take 1 tablet by mouth daily.   FISH OIL-OMEGA-3 FATTY ACIDS 1000 MG CAPSULE    Take 1 g by mouth daily.    KOMBIGLYZE XR 2.07-998 MG TB24    Take 1 tablet by mouth daily.   LEVOTHYROXINE (SYNTHROID) 125 MCG TABLET    levothyroxine sodium  125 mcg tabs   LIRAGLUTIDE (VICTOZA) 18 MG/3ML SOPN    victoza  18 mg/583ml sopn   LISINOPRIL (ZESTRIL) 10 MG TABLET    lisinopril  10 mg tabs   METOPROLOL (LOPRESSOR) 50 MG TABLET    Take 1 tablet by mouth Twice daily.   MULTIPLE VITAMIN (MULTIVITAMIN) TABLET    Take 1 tablet by mouth daily.     SYNTHROID 50 MCG TABLET    1 tab by mouth once daily   ZOLPIDEM (AMBIEN) 5 MG TABLET    Take 5 mg by mouth at bedtime as needed.    Modified Medications   No medications on file  Discontinued Medications   No medications on file    Allergies: Allergies  Allergen Reactions  . Prednisone     Itching, flushing  . Sulfa Antibiotics     Childhood > rash     Past Medical History: Past Medical History:  Diagnosis Date  . Asthma   . DM type 2 (diabetes mellitus, type 2) (H[facility])   . Heart attack (H[facility]) 11/2009   2 stents  . HIV infection ([facility])   . HTN (hypertension)   . Hypercholesterolemia   . OSA (obstructive sleep apnea)   . Seasonal allergies     Social History: Social History   Socioeconomic History  . Marital status: Divorced    Spouse name: Not on file  . Number of children: 2  . Years of education: 1314  . Highest education level: Not on file  Occupational History  . Occupation: Production designer, theatre/television/filmmanager at [employer]  Social Needs  . Financial resource strain: Not on file  . Food insecurity    Worry: Not on file    Inability: Not on file  . Transportation needs    Medical: Not on file    Non-medical: Not on file  Tobacco Use  . Smoking status: Current Some Day Smoker    Packs/day: 2.00    Years: 15.00    Pack years: 30.00  Types: Cigarettes    Last attempt to quit: 03/30/1990    Years since quitting: 28.5  . Smokeless tobacco: Never Used  Substance and Sexual Activity  . Alcohol use: No  . Drug use: No  . Sexual activity: Not on file  Lifestyle  . Physical activity    Days per week: Not on file    Minutes per session: Not on file  . Stress: Not on file  Relationships  . Social Herbalist on phone: Not on file    Gets together: Not on file    Attends religious service: Not on file    Active member of club or organization: Not on file    Attends meetings of clubs or organizations: Not on file    Relationship status: Not on file  Other Topics Concern  . Not on file  Social History Narrative  . Not on file    Labs: Lab Results  Component Value Date   HIV1RNAQUANT 239 (H) 09/14/2018   CD4TABS 568 09/14/2018    RPR and STI Lab Results  Component Value Date   LABRPR NON-REACTIVE 09/14/2018    No flowsheet data found.  Hepatitis B Lab Results  Component Value Date   HEPBSAB NON-REACTIVE  09/14/2018   HEPBSAG NON-REACTIVE 09/14/2018   HEPBCAB NON-REACTIVE 09/14/2018   Hepatitis C Lab Results  Component Value Date   HEPCAB NON-REACTIVE 09/14/2018   Hepatitis A Lab Results  Component Value Date   HAV NON-REACTIVE 09/14/2018   Lipids: Lab Results  Component Value Date   CHOL 169 09/14/2018   TRIG 129 09/14/2018   HDL 32 (L) 09/14/2018   CHOLHDL 5.3 (H) 09/14/2018   LDLCALC 113 (H) 09/14/2018    Current HIV Regimen: Treatment naive - started Biktarvy a month ago  Assessment: Ethan Farrell is here today to initiate care with Marya Amsler for his newly diagnosed HIV infection.  He is treatment naive but started [facility] about a month ago.  His viral load is already down to 239.   Explained that [place] Odor is a one pill once daily medication with or without food and the importance of not missing any doses. Explained resistance and how it develops and why it is so important to take Biktarvy daily and not skip days or doses. Counseled patient to take it around the same time each day. Counseled on what to do if dose is missed, if closer to missed dose take immediately, if closer to next dose then skip and resume normal schedule.   Cautioned on possible side effects the first week or so including nausea, diarrhea, dizziness, and headaches but that they should resolve after the first couple of weeks. I reviewed patient medications and found no drug interactions. Counseled patient to separate Biktarvy from divalent cations including multivitamins. Discussed with patient to call clinic if he starts a new medication or herbal supplement. I gave the patient my card and told him to call me with any issues/questions/concerns.  He picks up his medication from [facility] in [place].   Plan: - Continue Biktarvy PO once daily - Call with any problems  Mareena Cavan L. Lysandra Loughmiller, PharmD, BCIDP, AAHIVP, [place] for Infectious Disease 10/04/2018, 3:56 PM

## 2018-10-04 NOTE — Assessment & Plan Note (Signed)
Ethan Farrell has likely improved viral load with 2 weeks of Biktarvy showing a viral load of 239.  Unclear CD4 nadir/viral load.  He has no signs/symptoms of opportunistic infection.  We discussed the pathogenesis, risks of progression if left untreated, transmission, protection, and treatment for HIV disease with time allotted for questions.  He has Pharmacist, community and should have no problems obtaining his medication.  Met with pharmacy staff and clinic introduction provided.  Continue current dose of Biktarvy.  Plan for follow-up in 1 month or sooner if needed.

## 2018-10-04 NOTE — Assessment & Plan Note (Signed)
Blood pressure slightly elevated above goal 140/90 today.  No changes in vision, blurred vision or headaches.  Encouraged to monitor blood pressure at home.  Continue current dose of metoprolol and lisinopril with management per primary care.

## 2018-10-06 ENCOUNTER — Ambulatory Visit: Payer: 59

## 2018-10-06 ENCOUNTER — Other Ambulatory Visit: Payer: 59

## 2018-10-24 ENCOUNTER — Encounter: Payer: 59 | Admitting: Infectious Disease

## 2018-10-24 ENCOUNTER — Ambulatory Visit: Payer: 59 | Admitting: Pharmacist

## 2018-11-03 ENCOUNTER — Encounter: Payer: Self-pay | Admitting: Infectious Disease

## 2018-11-08 ENCOUNTER — Other Ambulatory Visit: Payer: Self-pay

## 2018-11-08 ENCOUNTER — Ambulatory Visit (INDEPENDENT_AMBULATORY_CARE_PROVIDER_SITE_OTHER): Payer: 59 | Admitting: Family

## 2018-11-08 ENCOUNTER — Encounter: Payer: Self-pay | Admitting: Family

## 2018-11-08 VITALS — BP 181/80 | HR 73 | Temp 98.6°F

## 2018-11-08 DIAGNOSIS — Z0000 Encounter for general adult medical examination without abnormal findings: Secondary | ICD-10-CM | POA: Diagnosis not present

## 2018-11-08 DIAGNOSIS — B20 Human immunodeficiency virus [HIV] disease: Secondary | ICD-10-CM

## 2018-11-08 MED ORDER — BIKTARVY 50-200-25 MG PO TABS: 1.0000 | ORAL_TABLET | Freq: Every day | ORAL | 2 refills | Status: DC

## 2018-11-08 NOTE — Assessment & Plan Note (Signed)
Ethan Farrell has well-controlled HIV disease with good adherence and tolerance to his ART regimen of Biktarvy.  No signs/symptoms of opportunistic infection or progressive HIV disease.  Discussed undetectable being untransmitable.  He has no problems obtaining medication from the pharmacy.  Continue current dose of Biktarvy.  Check blood work today.  Plan for follow-up in 2 months or sooner if needed with lab work 1 to 2 weeks prior to appointment

## 2018-11-08 NOTE — Progress Notes (Signed)
Subjective:    Patient ID: Ethan Farrell, male    DOB: Jul 30, 1960, 58 y.o.   MRN: [id number]  Chief Complaint  Patient presents with  . Follow-up     HPI:  Ethan BonesRonald N Farrell is a 58 y.o. male with HIV disease who was last seen in the office on 10/04/18 for initial office visit follow recent diagnosis with HIV-1. CD4 nadir and viral load unclear as he was started on medication my PrEP provider. Initial clinic blood work with CD4 count of 568 and viral load of 239. He had good adherence and tolerance to his ART regimen of Biktarvy which he was on for 2 weeks at that time.   Ethan Farrell continues to take his Biktarvy as prescribed with no adverse side effects or missed doses. Overall feeling well with no new concerns. Denies fevers, chills, night sweats, headaches, changes in vision, neck pain/stiffness, nausea, diarrhea, vomiting, lesions or rashes.  Ethan Farrell continues to have [facility] and has no problems obtaining his medication from the pharmacy. Denies feelings of being down, depressed or hopeless. No recreational or illicit drug use or alcohol consumption. He does continue to smoke 1-1.5 pack per day. Not currently sexually active but does use condoms when he is. 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    Allergies  Allergen Reactions  . Prednisone     Itching, flushing  . Sulfa Antibiotics     Childhood > rash      Outpatient Medications Prior to Visit  Medication Sig Dispense Refill  . atorvastatin (LIPITOR) 80 MG tablet 1 tab by mouth at bedtime    . Canagliflozin-metFORMIN HCl (INVOKAMET) [phone] MG TABS invokamet    tab [phone]    . CIALIS 20 MG tablet Take 1 tablet by mouth as directed.    Marland Kitchen. dextromethorphan-guaiFENesin (MUCINEX DM) 30-600 MG per 12 hr tablet Take 1 tablet by mouth every 12 (twelve) hours as needed.     . DULoxetine (CYMBALTA) 30 MG capsule Take 30 mg by mouth daily.      Marland Kitchen. EFFIENT 10 MG TABS Take 1  tablet by mouth daily.    . fish oil-omega-3 fatty acids 1000 MG capsule Take 1 g by mouth daily.     Marland Kitchen. KOMBIGLYZE XR 2.07-998 MG TB24 Take 1 tablet by mouth daily.    Marland Kitchen. levothyroxine (SYNTHROID) 125 MCG tablet levothyroxine sodium  125 mcg tabs    . liraglutide (VICTOZA) 18 MG/3ML SOPN victoza  18 mg/213ml sopn    . lisinopril (ZESTRIL) 10 MG tablet lisinopril  10 mg tabs    . metoprolol (LOPRESSOR) 50 MG tablet Take 1 tablet by mouth Twice daily.    . Multiple Vitamin (MULTIVITAMIN) tablet Take 1 tablet by mouth daily.      Marland Kitchen. SYNTHROID 50 MCG tablet 1 tab by mouth once daily    . zolpidem (AMBIEN) 5 MG tablet Take 5 mg by mouth at bedtime as needed.      . bictegravir-emtricitabine-tenofovir AF (BIKTARVY) 50-200-25 MG TABS tablet Take 1 tablet by mouth daily. 30 tablet 2   No facility-administered medications prior to visit.      Past Medical History:  Diagnosis Date  . Asthma   . DM type 2 (diabetes mellitus, type 2) (HCC)   . Heart attack (HCC) 11/2009   2 stents  . HIV infection (HCC)   . HTN (hypertension)   . Hypercholesterolemia   . OSA (obstructive  sleep apnea)   . Seasonal allergies      Past Surgical History:  Procedure Laterality Date  . BACK SURGERY    . KNEE SURGERY     left  . right shoulder     right shoulder spurs  . ROTATOR CUFF REPAIR     right  . UMBILICAL HERNIA REPAIR         Review of Systems  Constitutional: Negative for appetite change, chills, fatigue, fever and unexpected weight change.  Eyes: Negative for visual disturbance.  Respiratory: Negative for cough, chest tightness, shortness of breath and wheezing.   Cardiovascular: Negative for chest pain and leg swelling.  Gastrointestinal: Negative for abdominal pain, constipation, diarrhea, nausea and vomiting.  Genitourinary: Negative for dysuria, flank pain, frequency, genital sores, hematuria and urgency.  Skin: Negative for rash.  Allergic/Immunologic: Negative for immunocompromised  state.  Neurological: Negative for dizziness and headaches.      Objective:    BP (!) 181/80   Pulse 73   Temp 98.6 F (37 C) (Oral)  Nursing note and vital signs reviewed.  Physical Exam Constitutional:      General: He is not in acute distress.    Appearance: He is well-developed.  Eyes:     Conjunctiva/sclera: Conjunctivae normal.  Neck:     Musculoskeletal: Neck supple.  Cardiovascular:     Rate and Rhythm: Normal rate and regular rhythm.     Heart sounds: Normal heart sounds. No murmur. No friction rub. No gallop.   Pulmonary:     Effort: Pulmonary effort is normal. No respiratory distress.     Breath sounds: Normal breath sounds. No wheezing or rales.  Chest:     Chest wall: No tenderness.  Abdominal:     General: Bowel sounds are normal.     Palpations: Abdomen is soft.     Tenderness: There is no abdominal tenderness.  Lymphadenopathy:     Cervical: No cervical adenopathy.  Skin:    General: Skin is warm and dry.     Findings: No rash.  Neurological:     Mental Status: He is alert and oriented to person, place, and time.  Psychiatric:        Behavior: Behavior normal.        Thought Content: Thought content normal.        Judgment: Judgment normal.      Depression screen PHQ 2/9 11/08/2018  Decreased Interest 0  Down, Depressed, Hopeless 0  PHQ - 2 Score 0       Assessment & Plan:   Problem List Items Addressed This Visit      Other   HIV disease (HCC) - Primary    Ethan Farrell has well-controlled HIV disease with good adherence and tolerance to his ART regimen of Biktarvy.  No signs/symptoms of opportunistic infection or progressive HIV disease.  Discussed undetectable being untransmitable.  He has no problems obtaining medication from the pharmacy.  Continue current dose of Biktarvy.  Check blood work today.  Plan for follow-up in 2 months or sooner if needed with lab work 1 to 2 weeks prior to appointment      Relevant Medications    bictegravir-emtricitabine-tenofovir AF (BIKTARVY) 50-200-25 MG TABS tablet   Other Relevant Orders   HIV-1 RNA quant-no reflex-bld   Comprehensive metabolic panel   T-helper cell (CD4)- ([facility] clinic only)   COMPLETE METABOLIC PANEL WITH GFR   HIV-1 RNA quant-no reflex-bld   T-helper cell (CD4)- ([facility] clinic only)  Healthcare maintenance     Discussed importance of safe sexual practices reduce risk of acquisition/transmission of STI.  Declines condoms.  Recommend flu vaccination in September.          I am having Ethan Farrell maintain his atorvastatin, Synthroid, metoprolol tartrate, Effient, Kombiglyze XR, Cialis, dextromethorphan-guaiFENesin, zolpidem, fish oil-omega-3 fatty acids, multivitamin, DULoxetine, Victoza, levothyroxine, lisinopril, Invokamet, and Biktarvy.   Meds ordered this encounter  Medications  . bictegravir-emtricitabine-tenofovir AF (BIKTARVY) 50-200-25 MG TABS tablet    Sig: Take 1 tablet by mouth daily.    Dispense:  30 tablet    Refill:  2    Order Specific Question:   Supervising Provider    Answer:   Carlyle Basques [[id number]]     Follow-up: Return in about 2 months (around 01/08/2019).   Terri Piedra, MSN, FNP-C Nurse Practitioner [facility] for Infectious Disease [place] number: [phone]

## 2018-11-08 NOTE — Assessment & Plan Note (Signed)
   Discussed importance of safe sexual practices reduce risk of acquisition/transmission of STI.  Declines condoms.  Recommend flu vaccination in September.

## 2018-11-08 NOTE — Patient Instructions (Signed)
Nice to see you.  We will check your lab work today.  Please continue to take your [place] daily.  Refills have been sent pharmacy.   Plan for follow up in 2 months or sooner if needed with lab work 1-2 weeks prior to appointment.   Have a great day and stay safe!

## 2018-11-09 LAB — T-HELPER CELL (CD4) - (RCID CLINIC ONLY)
CD4 % Helper T Cell: 39 % (ref 33–65)
CD4 T Cell Abs: 608 /uL (ref 400–1790)

## 2018-11-11 LAB — HIV-1 RNA QUANT-NO REFLEX-BLD
HIV 1 RNA Quant: 22 copies/mL — ABNORMAL HIGH
HIV-1 RNA Quant, Log: 1.34 Log copies/mL — ABNORMAL HIGH

## 2018-11-11 LAB — COMPLETE METABOLIC PANEL WITH GFR
AG Ratio: 1.6 (calc) (ref 1.0–2.5)
ALT: 13 U/L (ref 9–46)
AST: 15 U/L (ref 10–35)
Albumin: 4.1 g/dL (ref 3.6–5.1)
Alkaline phosphatase (APISO): 73 U/L (ref 35–144)
BUN: 20 mg/dL (ref 7–25)
CO2: 26 mmol/L (ref 20–32)
Calcium: 9.5 mg/dL (ref 8.6–10.3)
Chloride: 106 mmol/L (ref 98–110)
Creat: 1.08 mg/dL (ref 0.70–1.33)
GFR, Est African American: 87 mL/min/{1.73_m2} (ref >=60)
GFR, Est Non African American: 75 mL/min/{1.73_m2} (ref >=60)
Globulin: 2.6 g/dL (calc) (ref 1.9–3.7)
Glucose, Bld: 178 mg/dL — ABNORMAL HIGH (ref 65–99)
Potassium: 4.1 mmol/L (ref 3.5–5.3)
Sodium: 142 mmol/L (ref 135–146)
Total Bilirubin: 0.4 mg/dL (ref 0.2–1.2)
Total Protein: 6.7 g/dL (ref 6.1–8.1)

## 2018-11-14 ENCOUNTER — Telehealth: Payer: Self-pay | Admitting: *Deleted

## 2018-11-14 NOTE — Telephone Encounter (Signed)
Relayed to patient, confirmed upcoming appointment. Landis Gandy, RN

## 2018-11-14 NOTE — Telephone Encounter (Signed)
-----   Message from [place], Thor sent at 11/14/2018 11:46 AM EDT ----- Please inform Mr. Mane that his viral load is undetectable and CD4 count is 608. Continue to take [facility] as prescribed and follow up in October as planned.

## 2018-12-26 ENCOUNTER — Other Ambulatory Visit: Payer: 59

## 2018-12-26 ENCOUNTER — Other Ambulatory Visit: Payer: Self-pay

## 2018-12-26 DIAGNOSIS — B20 Human immunodeficiency virus [HIV] disease: Secondary | ICD-10-CM

## 2018-12-27 LAB — T-HELPER CELL (CD4) - (RCID CLINIC ONLY)
CD4 % Helper T Cell: 37 % (ref 33–65)
CD4 T Cell Abs: 699 /uL (ref 400–1790)

## 2018-12-28 LAB — COMPREHENSIVE METABOLIC PANEL
AG Ratio: 1.7 (calc) (ref 1.0–2.5)
ALT: 13 U/L (ref 9–46)
AST: 17 U/L (ref 10–35)
Albumin: 4.3 g/dL (ref 3.6–5.1)
Alkaline phosphatase (APISO): 76 U/L (ref 35–144)
BUN: 23 mg/dL (ref 7–25)
CO2: 26 mmol/L (ref 20–32)
Calcium: 9.3 mg/dL (ref 8.6–10.3)
Chloride: 106 mmol/L (ref 98–110)
Creat: 1.12 mg/dL (ref 0.70–1.33)
Globulin: 2.5 g/dL (calc) (ref 1.9–3.7)
Glucose, Bld: 175 mg/dL — ABNORMAL HIGH (ref 65–99)
Potassium: 4.1 mmol/L (ref 3.5–5.3)
Sodium: 142 mmol/L (ref 135–146)
Total Bilirubin: 0.6 mg/dL (ref 0.2–1.2)
Total Protein: 6.8 g/dL (ref 6.1–8.1)

## 2018-12-28 LAB — HIV-1 RNA QUANT-NO REFLEX-BLD
HIV 1 RNA Quant: <20 copies/mL — AB
HIV-1 RNA Quant, Log: <1.3 Log copies/mL — AB

## 2019-01-05 ENCOUNTER — Other Ambulatory Visit: Payer: Self-pay

## 2019-01-05 ENCOUNTER — Encounter: Payer: Self-pay | Admitting: Family

## 2019-01-05 ENCOUNTER — Telehealth: Payer: Self-pay | Admitting: Pharmacy Technician

## 2019-01-05 ENCOUNTER — Ambulatory Visit (INDEPENDENT_AMBULATORY_CARE_PROVIDER_SITE_OTHER): Payer: 59 | Admitting: Family

## 2019-01-05 DIAGNOSIS — Z0000 Encounter for general adult medical examination without abnormal findings: Secondary | ICD-10-CM

## 2019-01-05 DIAGNOSIS — I10 Essential (primary) hypertension: Secondary | ICD-10-CM | POA: Diagnosis not present

## 2019-01-05 DIAGNOSIS — B20 Human immunodeficiency virus [HIV] disease: Secondary | ICD-10-CM | POA: Diagnosis not present

## 2019-01-05 MED ORDER — LISINOPRIL 10 MG PO TABS: ORAL_TABLET | ORAL | 1 refills | Status: DC

## 2019-01-05 MED ORDER — LEVOTHYROXINE SODIUM 112 MCG PO TABS: 112.0000 ug | ORAL_TABLET | Freq: Every day | ORAL | 1 refills | Status: DC

## 2019-01-05 MED ORDER — BIKTARVY 50-200-25 MG PO TABS: 1.0000 | ORAL_TABLET | Freq: Every day | ORAL | 2 refills | Status: DC

## 2019-01-05 NOTE — Telephone Encounter (Signed)
[facility] Patient Advocate Encounter  Completed and sent [employer] Advancing Access application for [place] for this patient who is uninsured.    Patient is approved 01/05/2019 through 02/05/2019.  BIN      [id number] PCN    [id number] GRP    [id number] ID        [id number]  This will bridge him until he is HMAP approved.   Venida Jarvis. Nadara Mustard [place] Patient [facility] for Infectious Disease Phone: [phone] Fax:  [phone]

## 2019-01-05 NOTE — Patient Instructions (Signed)
Nice to see you.   Your medication has been sent to [place] to take your [place] as prescribed daily.  We will plan to follow up in 1 month or sooner if needed.   Please keep [place] posted.   Check in with Primary Care regarding your diabetes medications.

## 2019-01-05 NOTE — Progress Notes (Signed)
Subjective:    Patient ID: Ethan Farrell, male    DOB: 21-Oct-1960, 58 y.o.   MRN: [id number]  Chief Complaint  Patient presents with   Follow-up    recent job loss; connect with Ethan Farrell and pharmacy for resources     HPI:  Ethan Farrell is a 58 y.o. male his HIV disease, type 2 diabetes, hypertension, and hypothyroidism who was last seen in the office on 11/08/2018 for routine follow-up with well-controlled HIV disease with good adherence and tolerance to his ART regimen of Biktarvy having been newly diagnosed on 10/04/2018.  Blood work at that time showed a CD4 count of 608 and a viral load that was undetectable.  Most recent blood work completed on 12/26/2018 shows CD4 count of 699 and viral load that remains undetectable.  Healthcare maintenance due includes second dose of Pneumovax and Menveo vaccinations.  Ethan Farrell continues to take his Biktarvy as prescribed no adverse side effects or missed doses since his last office visit.  Overall feeling well today with no new concerns/complaints. Denies fevers, chills, night sweats, headaches, changes in vision, neck pain/stiffness, nausea, diarrhea, vomiting, lesions or rashes.  Ethan Farrell has unfortunately lost his job recently due to downsizing related to the coronavirus pandemic.  He has concerns regarding his ability to obtain his medication from the pharmacy.  He is feeling anxious today but denies feelings of being down, depressed, or hopeless.  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  Denies any recreational or illicit drug use, tobacco use, or alcohol consumption at present.  He has had no new partners and is not currently sexually active.  Allergies  Allergen Reactions   Prednisone     Itching, flushing   Sulfa Antibiotics     Childhood > rash      Outpatient Medications Prior to Visit  Medication Sig Dispense Refill   aspirin 81 MG  chewable tablet Chew by mouth.     atorvastatin (LIPITOR) 80 MG tablet 1 tab by mouth at bedtime     Canagliflozin-metFORMIN HCl (INVOKAMET) [phone] MG TABS invokamet    tab [phone]     liraglutide (VICTOZA) 18 MG/3ML SOPN victoza  18 mg/29ml sopn     albuterol (PROAIR HFA) 108 (90 Base) MCG/ACT inhaler ProAir HFA 90 mcg/actuation aerosol inhaler  Inhale 2 puffs every 4 hours by inhalation route as needed.     bictegravir-emtricitabine-tenofovir AF (BIKTARVY) 50-200-25 MG TABS tablet Take 1 tablet by mouth daily. 30 tablet 2   CIALIS 20 MG tablet Take 1 tablet by mouth as directed.     dextromethorphan-guaiFENesin (MUCINEX DM) 30-600 MG per 12 hr tablet Take 1 tablet by mouth every 12 (twelve) hours as needed.      DULoxetine (CYMBALTA) 30 MG capsule Take 30 mg by mouth daily.       fish oil-omega-3 fatty acids 1000 MG capsule Take 1 g by mouth daily.      gatifloxacin (ZYMAXID) 0.5 % SOLN INT 1 GTT IN OS QID     levothyroxine (SYNTHROID) 125 MCG tablet 112 mcg.     lisinopril (ZESTRIL) 10 MG tablet lisinopril  10 mg tabs     EFFIENT 10 MG TABS Take 1 tablet by mouth daily.     KOMBIGLYZE XR 2.07-998 MG TB24 Take 1 tablet by mouth daily.     metoprolol (LOPRESSOR) 50 MG tablet Take 1 tablet by mouth Twice daily.  Multiple Vitamin (MULTIVITAMIN) tablet Take 1 tablet by mouth daily.       sildenafil (REVATIO) 20 MG tablet Take 4 tablets by mouth one hour prior to sex as needed. Do not take more than one dose every 24 hours.     SYNTHROID 50 MCG tablet 1 tab by mouth once daily     zolpidem (AMBIEN) 5 MG tablet Take 5 mg by mouth at bedtime as needed.       No facility-administered medications prior to visit.      Past Medical History:  Diagnosis Date   Asthma    DM type 2 (diabetes mellitus, type 2) (HCC)    Heart attack (HCC) 11/2009   2 stents   HIV infection (HCC)    HTN (hypertension)    Hypercholesterolemia    OSA (obstructive sleep apnea)     Seasonal allergies      Past Surgical History:  Procedure Laterality Date   BACK SURGERY     KNEE SURGERY     left   right shoulder     right shoulder spurs   ROTATOR CUFF REPAIR     right   UMBILICAL HERNIA REPAIR         Review of Systems  Constitutional: Negative for appetite change, chills, fatigue, fever and unexpected weight change.  Eyes: Negative for visual disturbance.  Respiratory: Negative for cough, chest tightness, shortness of breath and wheezing.   Cardiovascular: Negative for chest pain and leg swelling.  Gastrointestinal: Negative for abdominal pain, constipation, diarrhea, nausea and vomiting.  Genitourinary: Negative for dysuria, flank pain, frequency, genital sores, hematuria and urgency.  Skin: Negative for rash.  Allergic/Immunologic: Negative for immunocompromised state.  Neurological: Negative for dizziness and headaches.      Objective:    BP (!) 162/78    Pulse 73    Temp 98.6 F (37 C) (Oral)    SpO2 96%  Nursing note and vital signs reviewed.  Physical Exam Constitutional:      General: He is not in acute distress.    Appearance: He is well-developed.  Eyes:     Conjunctiva/sclera: Conjunctivae normal.  Neck:     Musculoskeletal: Neck supple.  Cardiovascular:     Rate and Rhythm: Normal rate and regular rhythm.     Heart sounds: Normal heart sounds. No murmur. No friction rub. No gallop.   Pulmonary:     Effort: Pulmonary effort is normal. No respiratory distress.     Breath sounds: Normal breath sounds. No wheezing or rales.  Chest:     Chest wall: No tenderness.  Abdominal:     General: Bowel sounds are normal.     Palpations: Abdomen is soft.     Tenderness: There is no abdominal tenderness.  Lymphadenopathy:     Cervical: No cervical adenopathy.  Skin:    General: Skin is warm and dry.     Findings: No rash.  Neurological:     Mental Status: He is alert and oriented to person, place, and time.  Psychiatric:         Behavior: Behavior normal.        Thought Content: Thought content normal.        Judgment: Judgment normal.      [facility] 01/05/2019 11/08/2018  Decreased Interest 0 0  Down, Depressed, Hopeless 0 0  PHQ - 2 Score 0 0       Assessment & Plan:    Patient Active Problem List  Diagnosis Date Noted   Healthcare maintenance 11/08/2018   HIV disease (HCC) 10/04/2018   Type 2 diabetes mellitus without complications (HCC) 10/04/2018   Hypothyroidism 10/04/2018   Hypertension 10/04/2018   Bilateral pulmonary infiltrates on chest x-ray 07/24/2010   Pneumonitis, hypersensitivity (HCC) 07/24/2010   OSA on CPAP 07/24/2010     Problem List Items Addressed This Visit      Cardiovascular and Mediastinum   Hypertension    Ethan Farrell has elevated blood pressure today likely from increased anxiety and stress related to his recent loss of employment.  Encouraged him continue current dose of lisinopril and monitor blood pressure at home.  If average blood pressure reading remains elevated may need additional medications.      Relevant Medications   lisinopril (ZESTRIL) 10 MG tablet     Other   HIV disease [facility])    Ethan Farrell has well-controlled HIV disease with good adherence and tolerance to his ART regimen of Biktarvy and is now undetectable.  No signs/symptoms of opportunistic infection or progressive HIV disease at present.  We discussed in detail a plan for him to obtain his medications.  He is now approved for advancing access to cover his Biktarvy.  Discussed being able to obtain other medications from [facility] at discounted price.  Her discussion with pharmacy and financial counselors, his insurance appears active at present and encouraged to refill medications prior to expiration.  Continue current dose of Biktarvy.  Plan for follow-up in 2 months or sooner if needed with lab work 1 to 2 weeks prior to appointment.      Relevant Medications    bictegravir-emtricitabine-tenofovir AF (BIKTARVY) 50-200-25 MG TABS tablet   Healthcare maintenance     Declines immunizations today.  Discussed importance of safe sexual practice to reduce risk of acquisition/transmission of STI.          I have discontinued De Nurse. Waldren's Synthroid, metoprolol tartrate, Effient, Kombiglyze XR, Cialis, dextromethorphan-guaiFENesin, zolpidem, fish oil-omega-3 fatty acids, multivitamin, DULoxetine, levothyroxine, albuterol, gatifloxacin, and sildenafil. I have also changed his lisinopril. Additionally, I am having him start on levothyroxine. Lastly, I am having him maintain his atorvastatin, Victoza, Invokamet, aspirin, and Biktarvy.   Meds ordered this encounter  Medications   levothyroxine (SYNTHROID) 112 MCG tablet    Sig: Take 1 tablet (112 mcg total) by mouth daily before breakfast.    Dispense:  90 tablet    Refill:  1    Order Specific Question:   Supervising Provider    Answer:   Drue Second, CYNTHIA [[id number]]   lisinopril (ZESTRIL) 10 MG tablet    Sig: Take 1 tablet by mouth daily.    Dispense:  90 tablet    Refill:  1    Order Specific Question:   Supervising Provider    Answer:   Drue Second, CYNTHIA [[id number]]   bictegravir-emtricitabine-tenofovir AF (BIKTARVY) 50-200-25 MG TABS tablet    Sig: Take 1 tablet by mouth daily.    Dispense:  30 tablet    Refill:  2    Order Specific Question:   Supervising Provider    Answer:   Judyann Munson [[id number]]     Follow-up: Return in about 2 months (around 03/07/2019), or if symptoms worsen or fail to improve.   Marcos Eke, MSN, FNP-C Nurse Practitioner [facility] for Infectious Disease [facility] Main number: [phone]

## 2019-01-06 ENCOUNTER — Encounter: Payer: Self-pay | Admitting: Family

## 2019-01-06 NOTE — Assessment & Plan Note (Signed)
Mr. Marietta has well-controlled HIV disease with good adherence and tolerance to his ART regimen of Biktarvy and is now undetectable.  No signs/symptoms of opportunistic infection or progressive HIV disease at present.  We discussed in detail a plan for him to obtain his medications.  He is now approved for advancing access to cover his Biktarvy.  Discussed being able to obtain other medications from [facility] at discounted price.  Her discussion with pharmacy and financial counselors, his insurance appears active at present and encouraged to refill medications prior to expiration.  Continue current dose of Biktarvy.  Plan for follow-up in 2 months or sooner if needed with lab work 1 to 2 weeks prior to appointment.

## 2019-01-06 NOTE — Assessment & Plan Note (Signed)
Mr. Borkenhagen has elevated blood pressure today likely from increased anxiety and stress related to his recent loss of employment.  Encouraged him continue current dose of lisinopril and monitor blood pressure at home.  If average blood pressure reading remains elevated may need additional medications.

## 2019-01-06 NOTE — Assessment & Plan Note (Signed)
   Declines immunizations today.  Discussed importance of safe sexual practice to reduce risk of acquisition/transmission of STI.

## 2019-01-11 ENCOUNTER — Telehealth: Payer: Self-pay | Admitting: Pharmacy Technician

## 2019-01-11 NOTE — Telephone Encounter (Signed)
[facility] Patient Advocate Encounter  Called to explain further to patient that his insurance is active through October.  He will need to get a final fill of Biktarvy through [employer].  I have given him previously a 30 day [place] assistance card to get an additional fill to bridge him until [facility] can be approved.  He cannot apply to HMAP until the insurance is inactive and he will need to bring proof of income for both HMAP and any additional manufacturer assistance.  Venida Jarvis. Nadara Mustard [place] Patient [facility] for Infectious Disease Phone: [phone] Fax:  [phone]

## 2019-01-16 NOTE — Telephone Encounter (Addendum)
[facility] Patient Advocate Encounter   Patient has been approved for [employer] Advancing Access Patient Assistance Program for [place]  from 01/05/2019 to 03/30/2019. This assistance will make the patient's copay $0.  Received a second fax notification that has the patient's enrollment period 01/05/2019 to 01/05/2020. Same billing.  The billing information is as follows Member ID: [id number] [place]: [id number] PCN: [id number] Group: [id number]  See [name]'s note about insurance and UMAP application status. This program will cover him during that time period.  Patient knows to call the office with questions or concerns.  Ethan Farrell, CPhT Specialty Pharmacy Patient [facility] for Infectious Disease Phone: [phone] Fax: [phone] 01/16/2019 10:36 AM

## 2019-03-21 ENCOUNTER — Other Ambulatory Visit: Payer: Self-pay

## 2019-03-21 ENCOUNTER — Ambulatory Visit (INDEPENDENT_AMBULATORY_CARE_PROVIDER_SITE_OTHER): Payer: PRIVATE HEALTH INSURANCE | Admitting: Family

## 2019-03-21 ENCOUNTER — Encounter: Payer: Self-pay | Admitting: Family

## 2019-03-21 DIAGNOSIS — B20 Human immunodeficiency virus [HIV] disease: Secondary | ICD-10-CM | POA: Diagnosis not present

## 2019-03-21 DIAGNOSIS — I10 Essential (primary) hypertension: Secondary | ICD-10-CM | POA: Diagnosis not present

## 2019-03-21 DIAGNOSIS — Z0000 Encounter for general adult medical examination without abnormal findings: Secondary | ICD-10-CM

## 2019-03-21 MED ORDER — BIKTARVY 50-200-25 MG PO TABS: 1.0000 | ORAL_TABLET | Freq: Every day | ORAL | 5 refills | Status: DC

## 2019-03-21 NOTE — Patient Instructions (Signed)
Nice to see you.  Continue take your [place] daily.  We will check your blood work today.  Refills of been sent to the pharmacy.  Plan for follow-up in 4 months or sooner if needed with lab work 1 to 2 weeks prior to your appointment.  Have a great day and stay safe!  Happy Birthday!  Happy Holidays!

## 2019-03-21 NOTE — Assessment & Plan Note (Signed)
   Due for Pneumovax at next office visit  Discussed importance of safe sexual practice to reduce risk of STI.  Declines condoms.

## 2019-03-21 NOTE — Progress Notes (Signed)
Subjective:    Patient ID: Ethan Farrell, male    DOB: 1960-08-25, 58 y.o.   MRN: [id number]  Chief Complaint  Patient presents with  . Follow-up    no complaints; patient reports starting a new job recently; recieved flu shot 2020/2021; reports some sinus pressure, increased drainage.      HPI:  Ethan Farrell is a 58 y.o. male with HIV disease, diabetes, hypothyroidism, and hypertension who was previously seen in the office on 01/05/2019 with good adherence and tolerance to his ART regimen of Biktarvy.  Viral load at the time was undetectable with CD4 count of 608.  Most recent blood work completed on 12/26/2018 with a viral load that remains undetectable and CD4 count of 699.  Mr. Batterman continues to take his Biktarvy as prescribed no adverse side effects or missed doses since his last office visit.  Overall feeling well today with some sinus congestion. Denies fevers, chills, night sweats, headaches, changes in vision, neck pain/stiffness, nausea, diarrhea, vomiting, lesions or rashes.  Mr. Mojica has no problems obtaining his medication from the pharmacy and remains covered through primary physician care.  Denies feelings of being down, depressed, or hopeless recently.  No recreational or illicit drug use, tobacco use, or alcohol consumption.  He is not currently sexually active and declines condoms.    Allergies  Allergen Reactions  . Prednisone     Itching, flushing  . Sulfa Antibiotics     Childhood > rash      Outpatient Medications Prior to Visit  Medication Sig Dispense Refill  . aspirin 81 MG chewable tablet Chew by mouth.    Marland Kitchen atorvastatin (LIPITOR) 80 MG tablet 1 tab by mouth at bedtime    . bictegravir-emtricitabine-tenofovir AF (BIKTARVY) 50-200-25 MG TABS tablet Take 1 tablet by mouth daily. 30 tablet 2  . Canagliflozin-metFORMIN HCl (INVOKAMET) [phone] MG TABS invokamet    tab [phone]    . levothyroxine (SYNTHROID) 112 MCG tablet Take 1 tablet (112 mcg  total) by mouth daily before breakfast. 90 tablet 1  . liraglutide (VICTOZA) 18 MG/3ML SOPN victoza  18 mg/45ml sopn    . lisinopril (ZESTRIL) 10 MG tablet Take 1 tablet by mouth daily. 90 tablet 1   No facility-administered medications prior to visit.     Past Medical History:  Diagnosis Date  . Asthma   . DM type 2 (diabetes mellitus, type 2) (HCC)   . Heart attack (HCC) 11/2009   2 stents  . HIV infection (HCC)   . HTN (hypertension)   . Hypercholesterolemia   . OSA (obstructive sleep apnea)   . Seasonal allergies      Past Surgical History:  Procedure Laterality Date  . BACK SURGERY    . KNEE SURGERY     left  . right shoulder     right shoulder spurs  . ROTATOR CUFF REPAIR     right  . UMBILICAL HERNIA REPAIR      Review of Systems  Constitutional: Negative for appetite change, chills, fatigue, fever and unexpected weight change.  Eyes: Negative for visual disturbance.  Respiratory: Negative for cough, chest tightness, shortness of breath and wheezing.   Cardiovascular: Negative for chest pain and leg swelling.  Gastrointestinal: Negative for abdominal pain, constipation, diarrhea, nausea and vomiting.  Genitourinary: Negative for dysuria, flank pain, frequency, genital sores, hematuria and urgency.  Skin: Negative for rash.  Allergic/Immunologic: Negative for immunocompromised state.  Neurological: Negative for dizziness and headaches.  Objective:    BP (!) 156/79   Pulse 66   Wt 241 lb (109.3 kg)   BMI 31.80 kg/m  Nursing note and vital signs reviewed.  Physical Exam Constitutional:      General: He is not in acute distress.    Appearance: He is well-developed.  Cardiovascular:     Rate and Rhythm: Normal rate and regular rhythm.     Heart sounds: Normal heart sounds.  Pulmonary:     Effort: Pulmonary effort is normal.     Breath sounds: Normal breath sounds.  Skin:    General: Skin is warm and dry.  Neurological:     Mental Status: He is  alert and oriented to person, place, and time.  Psychiatric:        Behavior: Behavior normal.        Thought Content: Thought content normal.        Judgment: Judgment normal.      Depression screen [facility] 2/9 03/21/2019 01/05/2019 11/08/2018  Decreased Interest 0 0 0  Down, Depressed, Hopeless 0 0 0  PHQ - 2 Score 0 0 0       Assessment & Plan:    Patient Active Problem List   Diagnosis Date Noted  . Healthcare maintenance 11/08/2018  . HIV disease ([place]) 10/04/2018  . Type 2 diabetes mellitus without complications ([place]) 89/21/1941  . Hypothyroidism 10/04/2018  . Hypertension 10/04/2018  . Bilateral pulmonary infiltrates on chest x-ray 07/24/2010  . Pneumonitis, hypersensitivity ([place]) 07/24/2010  . OSA on CPAP 07/24/2010     Problem List Items Addressed This Visit    None       I am having Harrel Carina. Howk maintain his atorvastatin, Victoza, Invokamet, aspirin, levothyroxine, lisinopril, and Biktarvy.   No orders of the defined types were placed in this encounter.    Follow-up: No follow-ups on file.   Terri Piedra, MSN, FNP-C Nurse Practitioner [facility] for Infectious Disease Hand number: [phone]

## 2019-03-21 NOTE — Assessment & Plan Note (Signed)
Ethan Farrell continues to have well-controlled HIV disease with good adherence and tolerance to his ART regimen of Biktarvy.  No signs/symptoms of opportunistic infection or progressive HIV disease.  We reviewed his previous lab work and discussed the plan of care.  Check blood work today.  Continue current dose of Biktarvy.  Plan for follow-up in 4 months or sooner if needed with lab work 1 to 2 weeks prior to appointment.

## 2019-03-21 NOTE — Assessment & Plan Note (Signed)
Blood pressure slightly elevated above goal 140/90 today.  He continues to work on nutrition and continues to take his lisinopril as prescribed.  Continue to monitor blood pressure and continue current dose of lisinopril.

## 2019-03-22 LAB — T-HELPER CELL (CD4) - (RCID CLINIC ONLY)
CD4 % Helper T Cell: 34 % (ref 33–65)
CD4 T Cell Abs: 550 /uL (ref 400–1790)

## 2019-03-28 ENCOUNTER — Telehealth: Payer: Self-pay

## 2019-03-28 LAB — COMPLETE METABOLIC PANEL WITH GFR
AG Ratio: 1.6 (calc) (ref 1.0–2.5)
ALT: 15 U/L (ref 9–46)
AST: 17 U/L (ref 10–35)
Albumin: 4.1 g/dL (ref 3.6–5.1)
Alkaline phosphatase (APISO): 80 U/L (ref 35–144)
BUN: 14 mg/dL (ref 7–25)
CO2: 29 mmol/L (ref 20–32)
Calcium: 9.1 mg/dL (ref 8.6–10.3)
Chloride: 106 mmol/L (ref 98–110)
Creat: 1.01 mg/dL (ref 0.70–1.33)
GFR, Est African American: 94 mL/min/{1.73_m2} (ref >=60)
GFR, Est Non African American: 81 mL/min/{1.73_m2} (ref >=60)
Globulin: 2.5 g/dL (calc) (ref 1.9–3.7)
Glucose, Bld: 97 mg/dL (ref 65–99)
Potassium: 4 mmol/L (ref 3.5–5.3)
Sodium: 142 mmol/L (ref 135–146)
Total Bilirubin: 0.6 mg/dL (ref 0.2–1.2)
Total Protein: 6.6 g/dL (ref 6.1–8.1)

## 2019-03-28 LAB — HIV-1 RNA QUANT-NO REFLEX-BLD
HIV 1 RNA Quant: <20 copies/mL
HIV-1 RNA Quant, Log: <1.3 Log copies/mL

## 2019-03-28 NOTE — Telephone Encounter (Signed)
Left HIPAA compliant voicemail requesting patient to call office to lab results.   Griff Badley Lorita Officer, RN

## 2019-04-05 ENCOUNTER — Telehealth: Payer: Self-pay

## 2019-04-05 NOTE — Telephone Encounter (Signed)
Veryl Speak, FNP  P [facility] Triage Nurse Pool Please inform Mr. Hardacre that his viral load remains undetectable and CD4 count of 550. Kidney function, liver function, and electrolytes are all good. Continue to take Biktarvy as prescribed daily.    Patient called and left HIPPA compliant voicemail to call [phone] regarding recent labs. Will attempt to call again later. Valarie Cones

## 2019-07-06 ENCOUNTER — Other Ambulatory Visit: Payer: PRIVATE HEALTH INSURANCE

## 2019-07-17 ENCOUNTER — Telehealth: Payer: Self-pay | Admitting: Pharmacy Technician

## 2019-07-17 NOTE — Telephone Encounter (Signed)
[facility] Patient Advocate Encounter  [employer] faxed to say that Mr. Ethan Farrell assistance for HIV medication will term 07/14/2019.  He does not currently meet the criteria to get assistance again and most likely will need to apply for ADAP.  Netty Starring. Dimas Aguas CPhT [facility] Phone: [phone] Fax:  [phone]

## 2019-07-20 ENCOUNTER — Encounter: Payer: PRIVATE HEALTH INSURANCE | Admitting: Family

## 2019-08-07 ENCOUNTER — Other Ambulatory Visit: Payer: Commercial Managed Care - PPO

## 2019-08-07 ENCOUNTER — Other Ambulatory Visit: Payer: Self-pay

## 2019-08-07 DIAGNOSIS — B20 Human immunodeficiency virus [HIV] disease: Secondary | ICD-10-CM

## 2019-08-08 LAB — T-HELPER CELL (CD4) - (RCID CLINIC ONLY)
CD4 % Helper T Cell: 38 % (ref 33–65)
CD4 T Cell Abs: 763 /uL (ref 400–1790)

## 2019-08-09 LAB — CBC
HCT: 44.5 % (ref 38.5–50.0)
Hemoglobin: 15.1 g/dL (ref 13.2–17.1)
MCH: 32.5 pg (ref 27.0–33.0)
MCHC: 33.9 g/dL (ref 32.0–36.0)
MCV: 95.9 fL (ref 80.0–100.0)
MPV: 9.8 fL (ref 7.5–12.5)
Platelets: 175 10*3/uL (ref 140–400)
RBC: 4.64 10*6/uL (ref 4.20–5.80)
RDW: 12.9 % (ref 11.0–15.0)
WBC: 5.9 10*3/uL (ref 3.8–10.8)

## 2019-08-09 LAB — COMPREHENSIVE METABOLIC PANEL
AG Ratio: 1.8 (calc) (ref 1.0–2.5)
ALT: 14 U/L (ref 9–46)
AST: 16 U/L (ref 10–35)
Albumin: 4.3 g/dL (ref 3.6–5.1)
Alkaline phosphatase (APISO): 65 U/L (ref 35–144)
BUN: 16 mg/dL (ref 7–25)
CO2: 31 mmol/L (ref 20–32)
Calcium: 9.5 mg/dL (ref 8.6–10.3)
Chloride: 105 mmol/L (ref 98–110)
Creat: 1.15 mg/dL (ref 0.70–1.25)
Globulin: 2.4 g/dL (calc) (ref 1.9–3.7)
Glucose, Bld: 109 mg/dL — ABNORMAL HIGH (ref 65–99)
Potassium: 4.1 mmol/L (ref 3.5–5.3)
Sodium: 141 mmol/L (ref 135–146)
Total Bilirubin: 0.8 mg/dL (ref 0.2–1.2)
Total Protein: 6.7 g/dL (ref 6.1–8.1)

## 2019-08-09 LAB — HIV-1 RNA QUANT-NO REFLEX-BLD
HIV 1 RNA Quant: <20 copies/mL
HIV-1 RNA Quant, Log: <1.3 Log copies/mL

## 2019-08-21 ENCOUNTER — Ambulatory Visit (INDEPENDENT_AMBULATORY_CARE_PROVIDER_SITE_OTHER): Payer: Commercial Managed Care - PPO | Admitting: Family

## 2019-08-21 ENCOUNTER — Other Ambulatory Visit: Payer: Self-pay

## 2019-08-21 ENCOUNTER — Encounter: Payer: Self-pay | Admitting: Family

## 2019-08-21 VITALS — BP 172/72 | HR 68 | Wt 246.0 lb

## 2019-08-21 DIAGNOSIS — B20 Human immunodeficiency virus [HIV] disease: Secondary | ICD-10-CM

## 2019-08-21 DIAGNOSIS — Z113 Encounter for screening for infections with a predominantly sexual mode of transmission: Secondary | ICD-10-CM | POA: Diagnosis not present

## 2019-08-21 DIAGNOSIS — B009 Herpesviral infection, unspecified: Secondary | ICD-10-CM | POA: Diagnosis not present

## 2019-08-21 MED ORDER — BIKTARVY 50-200-25 MG PO TABS: 1.0000 | ORAL_TABLET | Freq: Every day | ORAL | 5 refills | Status: DC

## 2019-08-21 MED ORDER — VALACYCLOVIR HCL 1 G PO TABS: 1000.0000 mg | ORAL_TABLET | Freq: Two times a day (BID) | ORAL | 2 refills | Status: DC

## 2019-08-21 NOTE — Assessment & Plan Note (Signed)
Recently diagnosed with HSV infection currently on valacyclovir with improvement of symptoms.  Discussed the pathogenesis, treatment options, and suppression options for HSV.  At this point no suppression is needed as the risks of taking medication on a daily basis exceed the benefit.  Continue to monitor.

## 2019-08-21 NOTE — Assessment & Plan Note (Signed)
Ethan Farrell continues to have well-controlled HIV disease with good adherence and tolerance to his ART regimen of Biktarvy.  No signs/symptoms of opportunistic infection or progressive HIV disease.  We reviewed lab work and discussed the plan of care.  Continue current dose of Biktarvy.  Plan for follow-up in 4 months or sooner if needed with lab work 1 to 2 weeks prior to appointment.

## 2019-08-21 NOTE — Progress Notes (Signed)
Subjective:    Patient ID: Ethan Farrell, male    DOB: 12/27/1960, 59 y.o.   MRN: [id number]  Chief Complaint  Patient presents with  . Follow-up    no complaints     HPI:  LOKI Ethan Farrell is a 59 y.o. male with HIV disease last seen in the office on 03/21/19 with good adherence and tolerance to his ART regimen of Biktarvy.  Blood work at the time showed a viral load that was undetectable with CD4 count of 550.  Most recent blood work completed on 08/07/2019 with a viral load that remains undetectable and CD4 count of 763.  Kidney function, liver function, electrolytes within normal ranges.  Mr. Scull continues to take his Biktarvy as prescribed no adverse side effects or missed doses since his last office visit.  Overall feeling well today with no new concerns/complaints.  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 Denies fevers, chills, night sweats, headaches, changes in vision, neck pain/stiffness, nausea, diarrhea, vomiting, lesions or rashes.   Mr. Hardrick has no problems obtaining his medication from the pharmacy and remains covered through [place] healthcare.  Denies feelings of being down, depressed, or hopeless recently.  No recreational or illicit drug use or alcohol consumption.  He continues to smoke approximately 1/2 pack of cigarettes per day on average.  Declines condoms today.  Continues to work full-time.   Allergies  Allergen Reactions  . Prednisone     Itching, flushing  . Sulfa Antibiotics     Childhood > rash      Outpatient Medications Prior to Visit  Medication Sig Dispense Refill  . bictegravir-emtricitabine-tenofovir AF (BIKTARVY) 50-200-25 MG TABS tablet Take 1 tablet by mouth daily. 30 tablet 5  . aspirin 81 MG chewable tablet Chew by mouth.    Marland Kitchen atorvastatin (LIPITOR) 80 MG tablet 1 tab by mouth at bedtime    . Canagliflozin-metFORMIN HCl (INVOKAMET) [phone] MG TABS invokamet     tab [phone]    . levothyroxine (SYNTHROID) 112 MCG tablet Take 1 tablet (112 mcg total) by mouth daily before breakfast. 90 tablet 1  . levothyroxine (SYNTHROID) 125 MCG tablet Take 125 mcg by mouth every morning.    . liraglutide (VICTOZA) 18 MG/3ML SOPN victoza  18 mg/38ml sopn    . lisinopril (ZESTRIL) 10 MG tablet Take 1 tablet by mouth daily. 90 tablet 1  . meloxicam (MOBIC) 15 MG tablet Take 15 mg by mouth daily.    . metFORMIN (GLUCOPHAGE) 1000 MG tablet Take 1,000 mg by mouth daily.    . prednisoLONE acetate (PRED FORTE) 1 % ophthalmic suspension Place 1 drop into the right eye 4 (four) times daily.    . TRULICITY 4.09 WJ/1.9JY SOPN Inject 1 Syringe into the skin once a week.    . valACYclovir (VALTREX) 1000 MG tablet Take 1,000 mg by mouth 2 (two) times daily.     No facility-administered medications prior to visit.     Past Medical History:  Diagnosis Date  . Asthma   . DM type 2 (diabetes mellitus, type 2) ([place])   . Heart attack ([place]) 11/2009   2 stents  . HIV infection ([place])   . HTN (hypertension)   . Hypercholesterolemia   . OSA (obstructive sleep apnea)   . Seasonal allergies      Past Surgical History:  Procedure Laterality Date  . BACK SURGERY    . KNEE SURGERY  left  . right shoulder     right shoulder spurs  . ROTATOR CUFF REPAIR     right  . UMBILICAL HERNIA REPAIR       Review of Systems  Constitutional: Negative for appetite change, chills, fatigue, fever and unexpected weight change.  Eyes: Negative for visual disturbance.  Respiratory: Negative for cough, chest tightness, shortness of breath and wheezing.   Cardiovascular: Negative for chest pain and leg swelling.  Gastrointestinal: Negative for abdominal pain, constipation, diarrhea, nausea and vomiting.  Genitourinary: Negative for dysuria, flank pain, frequency, genital sores, hematuria and urgency.  Skin: Negative for rash.  Allergic/Immunologic: Negative for immunocompromised state.    Neurological: Negative for dizziness and headaches.      Objective:    BP (!) 172/72   Pulse 68   Wt 246 lb (111.6 kg)   BMI 32.46 kg/m  Nursing note and vital signs reviewed.  Physical Exam Constitutional:      General: He is not in acute distress.    Appearance: He is well-developed.  Eyes:     Conjunctiva/sclera: Conjunctivae normal.  Cardiovascular:     Rate and Rhythm: Normal rate and regular rhythm.     Heart sounds: Normal heart sounds. No murmur. No friction rub. No gallop.   Pulmonary:     Effort: Pulmonary effort is normal. No respiratory distress.     Breath sounds: Normal breath sounds. No wheezing or rales.  Chest:     Chest wall: No tenderness.  Abdominal:     General: Bowel sounds are normal.     Palpations: Abdomen is soft.     Tenderness: There is no abdominal tenderness.  Musculoskeletal:     Cervical back: Neck supple.  Lymphadenopathy:     Cervical: No cervical adenopathy.  Skin:    General: Skin is warm and dry.     Findings: No rash.  Neurological:     Mental Status: He is alert and oriented to person, place, and time.  Psychiatric:        Behavior: Behavior normal.        Thought Content: Thought content normal.        Judgment: Judgment normal.      Depression screen [facility] 2/9 08/21/2019 03/21/2019 01/05/2019 11/08/2018  Decreased Interest 0 0 0 0  Down, Depressed, Hopeless 0 0 0 0  PHQ - 2 Score 0 0 0 0       Assessment & Plan:    Patient Active Problem List   Diagnosis Date Noted  . 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 Healthcare maintenance 11/08/2018  . HIV disease (HCC) 10/04/2018  . Type 2 diabetes mellitus without complications (HCC) 10/04/2018  . Hypothyroidism 10/04/2018  . Hypertension 10/04/2018  . Bilateral pulmonary infiltrates on chest x-ray 07/24/2010  . Pneumonitis, hypersensitivity (HCC) 07/24/2010  . OSA on CPAP 07/24/2010     Problem List Items Addressed This Visit      Other   HIV disease [facility])    Mr. Garfinkle  continues to have well-controlled HIV disease with good adherence and tolerance to his ART regimen of Biktarvy.  No signs/symptoms of opportunistic infection or progressive HIV disease.  We reviewed lab work and discussed the plan of care.  Continue current dose of Biktarvy.  Plan for follow-up in 4 months or sooner if needed with lab work 1 to 2 weeks prior to appointment.      Relevant Medications   valACYclovir (VALTREX) 1000 MG tablet   bictegravir-emtricitabine-tenofovir AF (BIKTARVY) 50-200-25  MG TABS tablet   Other Relevant Orders   HIV-1 RNA quant-no reflex-bld   T-helper cell (CD4)- ([facility] clinic only)   Comprehensive metabolic panel   HSV infection    Recently diagnosed with HSV infection currently on valacyclovir with improvement of symptoms.  Discussed the pathogenesis, treatment options, and suppression options for HSV.  At this point no suppression is needed as the risks of taking medication on a daily basis exceed the benefit.  Continue to monitor.      Relevant Medications   valACYclovir (VALTREX) 1000 MG tablet   bictegravir-emtricitabine-tenofovir AF (BIKTARVY) 50-200-25 MG TABS tablet    Other Visit Diagnoses    Screening for STDs (sexually transmitted diseases)    -  Primary   Relevant Orders   RPR       I have changed De Nurse. Maya's valACYclovir. I am also having him maintain his atorvastatin, Victoza, Invokamet, aspirin, levothyroxine, lisinopril, Trulicity, levothyroxine, meloxicam, metFORMIN, prednisoLONE acetate, and Biktarvy.   Meds ordered this encounter  Medications  . valACYclovir (VALTREX) 1000 MG tablet    Sig: Take 1 tablet (1,000 mg total) by mouth 2 (two) times daily.    Dispense:  14 tablet    Refill:  2    Order Specific Question:   Supervising Provider    Answer:   Judyann Munson [[id number]]  . bictegravir-emtricitabine-tenofovir AF (BIKTARVY) 50-200-25 MG TABS tablet    Sig: Take 1 tablet by mouth daily.    Dispense:  30 tablet    Refill:   5    Order Specific Question:   Supervising Provider    Answer:   Judyann Munson [[id number]]     Follow-up: Return in about 4 months (around 12/22/2019), or if symptoms worsen or fail to improve.   Marcos Eke, MSN, FNP-C Nurse Practitioner [facility] for Infectious Disease [facility] Main number: [phone]

## 2019-08-21 NOTE — Patient Instructions (Signed)
Nice to see you.  We will continue your Biktarvy daily.  Take valacyclovir as needed for outbreaks.   Refills have been sent to the pharmacy.  Plan for follow up office visit in 4 months or sooner if needed with lab work 1-2 weeks prior to appointment.   Have a great day and stay safe!

## 2019-11-08 ENCOUNTER — Telehealth: Payer: Self-pay | Admitting: *Deleted

## 2019-11-08 NOTE — Telephone Encounter (Signed)
PA initiated via [email or web address] for Biktarvy as a continuation of initial therapy.  Will follow. Andree Coss, RN

## 2019-11-10 DIAGNOSIS — E119 Type 2 diabetes mellitus without complications: Secondary | ICD-10-CM | POA: Insufficient documentation

## 2019-11-10 DIAGNOSIS — E1142 Type 2 diabetes mellitus with diabetic polyneuropathy: Secondary | ICD-10-CM | POA: Insufficient documentation

## 2019-11-10 DIAGNOSIS — I2510 Atherosclerotic heart disease of native coronary artery without angina pectoris: Secondary | ICD-10-CM | POA: Insufficient documentation

## 2019-11-10 DIAGNOSIS — G5623 Lesion of ulnar nerve, bilateral upper limbs: Secondary | ICD-10-CM | POA: Insufficient documentation

## 2019-11-14 NOTE — Telephone Encounter (Signed)
PA approved from 11/03/19-03/04/20. Called pharmacy to see if they could run it, however too soon for refills. Will try again when it's due (8.29).   Jeannine Pennisi Loyola Mast, RN

## 2020-02-14 ENCOUNTER — Other Ambulatory Visit: Payer: Self-pay

## 2020-02-14 ENCOUNTER — Other Ambulatory Visit: Payer: Commercial Managed Care - PPO

## 2020-02-14 DIAGNOSIS — Z113 Encounter for screening for infections with a predominantly sexual mode of transmission: Secondary | ICD-10-CM

## 2020-02-14 DIAGNOSIS — B20 Human immunodeficiency virus [HIV] disease: Secondary | ICD-10-CM

## 2020-02-15 LAB — T-HELPER CELL (CD4) - (RCID CLINIC ONLY)
CD4 % Helper T Cell: 37 % (ref 33–65)
CD4 T Cell Abs: 701 /uL (ref 400–1790)

## 2020-02-16 LAB — COMPREHENSIVE METABOLIC PANEL
AG Ratio: 1.9 (calc) (ref 1.0–2.5)
ALT: 13 U/L (ref 9–46)
AST: 17 U/L (ref 10–35)
Albumin: 4.1 g/dL (ref 3.6–5.1)
Alkaline phosphatase (APISO): 76 U/L (ref 35–144)
BUN: 14 mg/dL (ref 7–25)
CO2: 28 mmol/L (ref 20–32)
Calcium: 9.4 mg/dL (ref 8.6–10.3)
Chloride: 106 mmol/L (ref 98–110)
Creat: 1.02 mg/dL (ref 0.70–1.25)
Globulin: 2.2 g/dL (calc) (ref 1.9–3.7)
Glucose, Bld: 114 mg/dL — ABNORMAL HIGH (ref 65–99)
Potassium: 4 mmol/L (ref 3.5–5.3)
Sodium: 141 mmol/L (ref 135–146)
Total Bilirubin: 0.5 mg/dL (ref 0.2–1.2)
Total Protein: 6.3 g/dL (ref 6.1–8.1)

## 2020-02-16 LAB — FLUORESCENT TREPONEMAL AB(FTA)-IGG-BLD: Fluorescent Treponemal ABS: REACTIVE — AB

## 2020-02-16 LAB — RPR: RPR Ser Ql: REACTIVE — AB

## 2020-02-16 LAB — HIV-1 RNA QUANT-NO REFLEX-BLD
HIV 1 RNA Quant: <20 Copies/mL — ABNORMAL HIGH
HIV-1 RNA Quant, Log: <1.3 Log cps/mL — ABNORMAL HIGH

## 2020-02-16 LAB — RPR TITER: RPR Titer: 1:1 {titer} — ABNORMAL HIGH

## 2020-02-28 ENCOUNTER — Other Ambulatory Visit: Payer: Self-pay

## 2020-02-28 ENCOUNTER — Ambulatory Visit (INDEPENDENT_AMBULATORY_CARE_PROVIDER_SITE_OTHER): Payer: Commercial Managed Care - PPO | Admitting: Family

## 2020-02-28 ENCOUNTER — Encounter: Payer: Self-pay | Admitting: Family

## 2020-02-28 VITALS — BP 160/70 | HR 68 | Temp 98.0°F | Wt 260.0 lb

## 2020-02-28 DIAGNOSIS — A539 Syphilis, unspecified: Secondary | ICD-10-CM

## 2020-02-28 DIAGNOSIS — B009 Herpesviral infection, unspecified: Secondary | ICD-10-CM

## 2020-02-28 DIAGNOSIS — B20 Human immunodeficiency virus [HIV] disease: Secondary | ICD-10-CM

## 2020-02-28 DIAGNOSIS — Z79899 Other long term (current) drug therapy: Secondary | ICD-10-CM

## 2020-02-28 MED ORDER — VALACYCLOVIR HCL 1 G PO TABS: 1000.0000 mg | ORAL_TABLET | Freq: Two times a day (BID) | ORAL | 2 refills | Status: DC

## 2020-02-28 MED ORDER — BIKTARVY 50-200-25 MG PO TABS: 1.0000 | ORAL_TABLET | Freq: Every day | ORAL | 5 refills | Status: DC

## 2020-02-28 MED ORDER — DOXYCYCLINE HYCLATE 100 MG PO TABS: 100.0000 mg | ORAL_TABLET | Freq: Two times a day (BID) | ORAL | 0 refills | Status: DC

## 2020-02-28 NOTE — Progress Notes (Signed)
Subjective:    Patient ID: Ethan Farrell, male    DOB: April 22, 1960, 59 y.o.   MRN: [id number]  No chief complaint on file.    HPI:  Ethan Farrell is a 59 y.o. male with HIV disease who was last seen in the office on 08/21/2019 with good adherence and tolerance to his ART regimen of Biktarvy.  Viral load at the time was undetectable with CD4 count of 763.  Most recent blood work completed on 02/14/2020 with viral load that remains undetectable and CD4 count of 701. RPR previously nonreactive now reactive with positive confirmation and titer of 1: 1. Kidney function, liver function, electrolytes within normal ranges. Here today for routine follow-up.  Mr. Ethan Farrell continues to take his Biktarvy daily as prescribed with no adverse side effects or missed doses since his last office visit. Overall feeling well today with no new concerns/complaints. Denies fevers, chills, night sweats, headaches, changes in vision, neck pain/stiffness, nausea, diarrhea, vomiting, lesions or rashes.  Mr. Ethan Farrell has no problem obtaining his medication from the pharmacy and remains covered through [place] healthcare. Denies feelings of being down, depressed, or hopeless recently. No recreational or illicit drug use or alcohol consumption. Current some day smoker. Booster dose for COVID vaccine scheduled. Dental care is up-to-date per recommendations. Condoms declined.   Allergies  Allergen Reactions  . Prednisone     Itching, flushing  . Sulfa Antibiotics     Childhood > rash      Outpatient Medications Prior to Visit  Medication Sig Dispense Refill  . aspirin 81 MG chewable tablet Chew by mouth.    Marland Kitchen atorvastatin (LIPITOR) 80 MG tablet 1 tab by mouth at bedtime    . bictegravir-emtricitabine-tenofovir AF (BIKTARVY) 50-200-25 MG TABS tablet Take 1 tablet by mouth daily. 30 tablet 5  . Canagliflozin-metFORMIN HCl (INVOKAMET) [phone] MG TABS invokamet    tab [phone]    . levothyroxine (SYNTHROID) 112 MCG  tablet Take 1 tablet (112 mcg total) by mouth daily before breakfast. 90 tablet 1  . levothyroxine (SYNTHROID) 125 MCG tablet Take 125 mcg by mouth every morning.    . liraglutide (VICTOZA) 18 MG/3ML SOPN victoza  18 mg/25ml sopn    . lisinopril (ZESTRIL) 10 MG tablet Take 1 tablet by mouth daily. 90 tablet 1  . meloxicam (MOBIC) 15 MG tablet Take 15 mg by mouth daily.    . metFORMIN (GLUCOPHAGE) 1000 MG tablet Take 1,000 mg by mouth daily.    . prednisoLONE acetate (PRED FORTE) 1 % ophthalmic suspension Place 1 drop into the right eye 4 (four) times daily.    . TRULICITY 0.75 MG/0.5ML SOPN Inject 1 Syringe into the skin once a week.    . valACYclovir (VALTREX) 1000 MG tablet Take 1 tablet (1,000 mg total) by mouth 2 (two) times daily. 14 tablet 2   No facility-administered medications prior to visit.     Past Medical History:  Diagnosis Date  . Asthma   . DM type 2 (diabetes mellitus, type 2) (HCC)   . Heart attack (HCC) 11/2009   2 stents  . HIV infection (HCC)   . HTN (hypertension)   . Hypercholesterolemia   . OSA (obstructive sleep apnea)   . Seasonal allergies      Past Surgical History:  Procedure Laterality Date  . BACK SURGERY    . KNEE SURGERY     left  . right shoulder     right shoulder spurs  . ROTATOR CUFF REPAIR  right  . UMBILICAL HERNIA REPAIR         Review of Systems  Constitutional: Negative for appetite change, chills, fatigue, fever and unexpected weight change.  Eyes: Negative for visual disturbance.  Respiratory: Negative for cough, chest tightness, shortness of breath and wheezing.   Cardiovascular: Negative for chest pain and leg swelling.  Gastrointestinal: Negative for abdominal pain, constipation, diarrhea, nausea and vomiting.  Genitourinary: Negative for dysuria, flank pain, frequency, genital sores, hematuria and urgency.  Skin: Negative for rash.  Allergic/Immunologic: Negative for immunocompromised state.  Neurological: Negative  for dizziness and headaches.      Objective:    BP (!) 160/70   Pulse 68   Temp 98 F (36.7 C)   Wt 260 lb (117.9 kg)   BMI 34.30 kg/m  Nursing note and vital signs reviewed.  Physical Exam Constitutional:      General: He is not in acute distress.    Appearance: He is well-developed.  Eyes:     Conjunctiva/sclera: Conjunctivae normal.  Cardiovascular:     Rate and Rhythm: Normal rate and regular rhythm.     Heart sounds: Normal heart sounds. No murmur heard.  No friction rub. No gallop.   Pulmonary:     Effort: Pulmonary effort is normal. No respiratory distress.     Breath sounds: Normal breath sounds. No wheezing or rales.  Chest:     Chest wall: No tenderness.  Abdominal:     General: Bowel sounds are normal.     Palpations: Abdomen is soft.     Tenderness: There is no abdominal tenderness.  Musculoskeletal:     Cervical back: Neck supple.  Lymphadenopathy:     Cervical: No cervical adenopathy.  Skin:    General: Skin is warm and dry.     Findings: No rash.  Neurological:     Mental Status: He is alert and oriented to person, place, and time.  Psychiatric:        Behavior: Behavior normal.        Thought Content: Thought content normal.        Judgment: Judgment normal.      Depression screen [facility] 2/9 02/28/2020 08/21/2019 03/21/2019 01/05/2019 11/08/2018  Decreased Interest 0 0 0 0 0  Down, Depressed, Hopeless 0 0 0 0 0  PHQ - 2 Score 0 0 0 0 0       Assessment & Plan:    Patient Active Problem List   Diagnosis Date Noted  . 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 Healthcare maintenance 11/08/2018  . HIV disease (HCC) 10/04/2018  . Type 2 diabetes mellitus without complications (HCC) 10/04/2018  . Hypothyroidism 10/04/2018  . Hypertension 10/04/2018  . Bilateral pulmonary infiltrates on chest x-ray 07/24/2010  . Pneumonitis, hypersensitivity (HCC) 07/24/2010  . OSA on CPAP 07/24/2010     Problem List Items Addressed This Visit    None       I am  having De Nurse. Gloeckner maintain his atorvastatin, Victoza, Invokamet, aspirin, levothyroxine, lisinopril, Trulicity, levothyroxine, meloxicam, metFORMIN, prednisoLONE acetate, valACYclovir, and Biktarvy.   No orders of the defined types were placed in this encounter.    Follow-up: No follow-ups on file.   Marcos Eke, MSN, FNP-C Nurse Practitioner [facility] for Infectious Disease [facility] Main number: [phone]

## 2020-02-28 NOTE — Patient Instructions (Addendum)
Nice to see you.  Continue to take your [place] daily as prescribed.  Refills have been sent to the pharmacy.  Start taking doxycycline for the next 4 weeks until completed.   Hold off taking valacyclovir unless you have an outbreak.   Plan for follow up in 4 months or sooner if needed with lab work 1-2 weeks prior to appointment.   Have a great day and stay safe!  Happy Holidays!

## 2020-02-29 DIAGNOSIS — A539 Syphilis, unspecified: Secondary | ICD-10-CM | POA: Insufficient documentation

## 2020-02-29 NOTE — Assessment & Plan Note (Signed)
Ethan Farrell continues to have well-controlled HIV disease with good adherence and tolerance to his ART regimen of Biktarvy. No signs/symptoms of opportunistic infection or progressive HIV disease. Reviewed previous lab work and discussed plan of care. Continue current dose of Biktarvy. Plan for follow-up in 4 months or sooner if needed with lab work 1 to 2 weeks prior to appointment.

## 2020-02-29 NOTE — Assessment & Plan Note (Signed)
Previous history of HSV infection with no recent outbreaks and has been taking valacyclovir. Discussed with minimal outbreaks there is no current indication for him to continue with prophylaxis. Recommend using valacyclovir as needed for outbreaks and continuing to monitor.

## 2020-02-29 NOTE — Assessment & Plan Note (Signed)
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 No current symptoms which would be consistent with late latent syphilis given his last RPR was nonreactive over 1 year ago. Treat with 28 days of doxycycline per his request. Discussed abstaining from sexual contact until he 7 days of completion of doxycycline treatment. Recheck RPR in 3 months.

## 2020-05-01 ENCOUNTER — Telehealth: Payer: Self-pay

## 2020-05-01 NOTE — Telephone Encounter (Signed)
[facility] Patient Advocate Encounter   Received notification from [employer] that prior authorization for BIKTARVY is required.   PA submitted on 05/01/20 Sent by Fax [phone]  Status is pending  Follow up # [phone]    [facility] will continue to follow.   Clearance Coots, CPhT Specialty Pharmacy Patient [facility] Disease Phone: [phone] Fax:  [phone]

## 2020-05-01 NOTE — Telephone Encounter (Signed)
[facility] Patient Advocate Encounter  I gave patient 2 sample bottles of Biktarvy (14 tablets). Until his PA is approved.  Clearance Coots, CPhT Specialty Pharmacy Patient [facility] for Infectious Disease Phone: [phone] Fax:  [phone]

## 2020-05-01 NOTE — Telephone Encounter (Signed)
Patient ran out of medication last night. Patient states he will come by the office for samples until PA status is updated. Valarie Cones

## 2020-05-02 ENCOUNTER — Telehealth: Payer: Self-pay

## 2020-05-02 NOTE — Telephone Encounter (Signed)
PA initiated on Biktarvy via CoverMyMeds. Awaiting response.   Key: [id number] Name: Renovato DOB: [birth year]  Rosanna Randy, RN

## 2020-05-03 NOTE — Telephone Encounter (Signed)
Biktarvy has been approved. Prior authorization expires 05/27/2022.  Patient and pharmacy updated.  Ethan Farrell

## 2020-06-10 ENCOUNTER — Other Ambulatory Visit: Payer: Commercial Managed Care - PPO

## 2020-06-25 ENCOUNTER — Ambulatory Visit: Payer: Commercial Managed Care - PPO | Admitting: Family

## 2020-06-25 ENCOUNTER — Encounter: Payer: Self-pay | Admitting: Family

## 2020-06-25 ENCOUNTER — Other Ambulatory Visit: Payer: Self-pay

## 2020-06-25 VITALS — BP 186/80 | HR 76 | Wt 269.0 lb

## 2020-06-25 DIAGNOSIS — A539 Syphilis, unspecified: Secondary | ICD-10-CM

## 2020-06-25 DIAGNOSIS — Z23 Encounter for immunization: Secondary | ICD-10-CM | POA: Diagnosis not present

## 2020-06-25 DIAGNOSIS — Z0000 Encounter for general adult medical examination without abnormal findings: Secondary | ICD-10-CM | POA: Diagnosis not present

## 2020-06-25 DIAGNOSIS — B20 Human immunodeficiency virus [HIV] disease: Secondary | ICD-10-CM | POA: Diagnosis not present

## 2020-06-25 MED ORDER — BIKTARVY 50-200-25 MG PO TABS
1.0000 | ORAL_TABLET | Freq: Every day | ORAL | 5 refills | Status: DC
Start: 2020-06-25 — End: 2020-12-31

## 2020-06-25 NOTE — Patient Instructions (Signed)
Nice to see you.  We will check your lab work today.  Continue to take your Dalton daily as prescribed.   Refills have been sent to the pharmacy.   Recommend routine dental care.   Plan for follow up in 6 months or sooner if needed with lab work 1-2 weeks prior to appointment or on same day.  Have a great day and stay safe!

## 2020-06-25 NOTE — Progress Notes (Signed)
Subjective:    Patient ID: Ethan Farrell, male    DOB: 09-21-1960, 60 y.o.   MRN: [id number]  Chief Complaint  Patient presents with  . Follow-up    Declined condoms; no concerns/questions       HPI:  Ethan Farrell is a 60 y.o. male with HIV disease last seen on 02/28/2020 with well-controlled virus and good adherence and tolerance to his ART regimen of Biktarvy.  Viral load at the time was undetectable with CD4 count of 701.  RPR titer was newly positive at 1: 1 and treated with doxycycline.  Here today for routine follow-up.  Ethan Farrell continues to take his Biktarvy daily as prescribed with no adverse side effects or missed doses since his last office visit.  Overall feeling well today with no new concerns/complaints. Denies fevers, chills, night sweats, headaches, changes in vision, neck pain/stiffness, nausea, diarrhea, vomiting, lesions or rashes.  Ethan Farrell has no problems obtaining medication from the pharmacy remains covered [place] healthcare.  Denies feelings of being down, depressed, or hopeless recently.  Due for routine dental care.  Continues to smoke approximately 3/4 to 1 pack of cigarettes per day with no recreational or illicit drugs and no alcohol.  Healthcare maintenance due includes Pneumovax.  Condoms declined.   Allergies  Allergen Reactions  . Prednisone     Itching, flushing  . Sulfa Antibiotics     Childhood > rash      Outpatient Medications Prior to Visit  Medication Sig Dispense Refill  . aspirin 81 MG chewable tablet Chew by mouth.    Marland Kitchen atorvastatin (LIPITOR) 80 MG tablet 1 tab by mouth at bedtime    . levothyroxine (SYNTHROID) 112 MCG tablet Take 1 tablet (112 mcg total) by mouth daily before breakfast. 90 tablet 1  . lisinopril (ZESTRIL) 10 MG tablet Take 1 tablet by mouth daily. 90 tablet 1  . metFORMIN (GLUCOPHAGE) 1000 MG tablet Take 1,000 mg by mouth daily.    . TRULICITY 0.75 MG/0.5ML SOPN Inject 1 Syringe into the skin once a week.     . valACYclovir (VALTREX) 1000 MG tablet Take 1 tablet (1,000 mg total) by mouth 2 (two) times daily. 10 tablet 2  . bictegravir-emtricitabine-tenofovir AF (BIKTARVY) 50-200-25 MG TABS tablet Take 1 tablet by mouth daily. 30 tablet 5  . levothyroxine (SYNTHROID) 125 MCG tablet Take 125 mcg by mouth every morning. (Patient not taking: Reported on 06/25/2020)    . Canagliflozin-metFORMIN HCl (INVOKAMET) [phone] MG TABS invokamet    tab [phone] (Patient not taking: Reported on 06/25/2020)    . doxycycline (VIBRA-TABS) 100 MG tablet Take 1 tablet (100 mg total) by mouth 2 (two) times daily. (Patient not taking: Reported on 06/25/2020) 56 tablet 0  . liraglutide (VICTOZA) 18 MG/3ML SOPN victoza  18 mg/53ml sopn (Patient not taking: Reported on 06/25/2020)    . meloxicam (MOBIC) 15 MG tablet Take 15 mg by mouth daily. (Patient not taking: Reported on 06/25/2020)    . prednisoLONE acetate (PRED FORTE) 1 % ophthalmic suspension Place 1 drop into the right eye 4 (four) times daily. (Patient not taking: Reported on 06/25/2020)     No facility-administered medications prior to visit.     Past Medical History:  Diagnosis Date  . Asthma   . DM type 2 (diabetes mellitus, type 2) (HCC)   . Heart attack (HCC) 11/2009   2 stents  . HIV infection (HCC)   . HTN (hypertension)   . Hypercholesterolemia   .  OSA (obstructive sleep apnea)   . Seasonal allergies      Past Surgical History:  Procedure Laterality Date  . BACK SURGERY    . CARPAL TUNNEL RELEASE Left   . KNEE SURGERY     left  . right shoulder     right shoulder spurs  . ROTATOR CUFF REPAIR     right  . UMBILICAL HERNIA REPAIR         Review of Systems  Constitutional: Negative for appetite change, chills, fatigue, fever and unexpected weight change.  Eyes: Negative for visual disturbance.  Respiratory: Negative for cough, chest tightness, shortness of breath and wheezing.   Cardiovascular: Negative for chest pain and leg swelling.   Gastrointestinal: Negative for abdominal pain, constipation, diarrhea, nausea and vomiting.  Genitourinary: Negative for dysuria, flank pain, frequency, genital sores, hematuria and urgency.  Skin: Negative for rash.  Allergic/Immunologic: Negative for immunocompromised state.  Neurological: Negative for dizziness and headaches.      Objective:    BP (!) 186/80   Pulse 76   Wt 269 lb (122 kg)   BMI 35.49 kg/m  Nursing note and vital signs reviewed.  Physical Exam Constitutional:      General: He is not in acute distress.    Appearance: He is well-developed.  Eyes:     Conjunctiva/sclera: Conjunctivae normal.  Cardiovascular:     Rate and Rhythm: Normal rate and regular rhythm.     Heart sounds: Normal heart sounds. No murmur heard. No friction rub. No gallop.   Pulmonary:     Effort: Pulmonary effort is normal. No respiratory distress.     Breath sounds: Normal breath sounds. No wheezing or rales.  Chest:     Chest wall: No tenderness.  Abdominal:     General: Bowel sounds are normal.     Palpations: Abdomen is soft.     Tenderness: There is no abdominal tenderness.  Musculoskeletal:     Cervical back: Neck supple.  Lymphadenopathy:     Cervical: No cervical adenopathy.  Skin:    General: Skin is warm and dry.     Findings: No rash.  Neurological:     Mental Status: He is alert and oriented to person, place, and time.  Psychiatric:        Behavior: Behavior normal.        Thought Content: Thought content normal.        Judgment: Judgment normal.      Depression screen [facility] 2/9 06/25/2020 02/28/2020 08/21/2019 03/21/2019 01/05/2019  Decreased Interest 0 0 0 0 0  Down, Depressed, Hopeless 0 0 0 0 0  PHQ - 2 Score 0 0 0 0 0       Assessment & Plan:    Patient Active Problem List   Diagnosis Date Noted  . Syphilis 02/29/2020  . 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 Healthcare maintenance 11/08/2018  . HIV disease (HCC) 10/04/2018  . Type 2 diabetes mellitus without  complications (HCC) 10/04/2018  . Hypothyroidism 10/04/2018  . Hypertension 10/04/2018  . Bilateral pulmonary infiltrates on chest x-ray 07/24/2010  . Pneumonitis, hypersensitivity (HCC) 07/24/2010  . OSA on CPAP 07/24/2010     Problem List Items Addressed This Visit      Other   HIV disease (HCC) - Primary    Mr. Boldman continues to have well-controlled HIV disease with good adherence and tolerance to his ART regimen of Biktarvy.  No signs/symptoms of opportunistic infection or progressive HIV.  We reviewed lab work  and discussed plan of care.  Check blood work today.  Continue current dose of Biktarvy.  Plan for follow-up in 6 months or sooner if needed.      Relevant Medications   bictegravir-emtricitabine-tenofovir AF (BIKTARVY) 50-200-25 MG TABS tablet   Other Relevant Orders   COMPLETE METABOLIC PANEL WITH GFR (Completed)   HIV-1 RNA quant-no reflex-bld   T-helper cell (CD4)- ([facility] clinic only) (Completed)   Healthcare maintenance     Gust importance of safe sexual practice to reduce risk of STI.  Condoms declined.  Due for routine dental care which will be scheduled independently.  Pneumovax updated today.      Syphilis    Previously diagnosed with syphilis titer positive at 1: 1 now status post treatment with doxycycline.  Currently without symptoms.  Recheck RPR.      Relevant Medications   bictegravir-emtricitabine-tenofovir AF (BIKTARVY) 50-200-25 MG TABS tablet   Other Relevant Orders   RPR (Completed)    Other Visit Diagnoses    Need for pneumococcal vaccination       Relevant Orders   Pneumococcal polysaccharide vaccine 23-valent greater than or equal to 2yo subcutaneous/IM (Completed)       I have discontinued De Nurse. Heidt's Victoza, Invokamet, meloxicam, prednisoLONE acetate, and doxycycline. I am also having him maintain his atorvastatin, aspirin, levothyroxine, lisinopril, Trulicity, levothyroxine, metFORMIN, valACYclovir, and Biktarvy.   Meds  ordered this encounter  Medications  . bictegravir-emtricitabine-tenofovir AF (BIKTARVY) 50-200-25 MG TABS tablet    Sig: Take 1 tablet by mouth daily.    Dispense:  30 tablet    Refill:  5    Order Specific Question:   Supervising Provider    Answer:   Judyann Munson [[id number]]     Follow-up: Return in about 6 months (around 12/26/2020), or if symptoms worsen or fail to improve.   Marcos Eke, MSN, FNP-C Nurse Practitioner [facility] for Infectious Disease [facility] Main number: [phone]

## 2020-06-26 LAB — T-HELPER CELL (CD4) - (RCID CLINIC ONLY)
CD4 % Helper T Cell: 37 % (ref 33–65)
CD4 T Cell Abs: 799 /uL (ref 400–1790)

## 2020-06-26 NOTE — Assessment & Plan Note (Signed)
Mr. Symes continues to have well-controlled HIV disease with good adherence and tolerance to his ART regimen of Biktarvy.  No signs/symptoms of opportunistic infection or progressive HIV.  We reviewed lab work and discussed plan of care.  Check blood work today.  Continue current dose of Biktarvy.  Plan for follow-up in 6 months or sooner if needed.

## 2020-06-26 NOTE — Assessment & Plan Note (Signed)
   Gust importance of safe sexual practice to reduce risk of STI.  Condoms declined.  Due for routine dental care which will be scheduled independently.  Pneumovax updated today.

## 2020-06-26 NOTE — Assessment & Plan Note (Signed)
Previously diagnosed with syphilis titer positive at 1: 1 now status post treatment with doxycycline.  Currently without symptoms.  Recheck RPR.

## 2020-06-27 LAB — RPR TITER: RPR Titer: 1:1 {titer} — ABNORMAL HIGH

## 2020-06-27 LAB — COMPLETE METABOLIC PANEL WITH GFR
AG Ratio: 1.7 (calc) (ref 1.0–2.5)
ALT: 12 U/L (ref 9–46)
AST: 15 U/L (ref 10–35)
Albumin: 4.3 g/dL (ref 3.6–5.1)
Alkaline phosphatase (APISO): 78 U/L (ref 35–144)
BUN: 17 mg/dL (ref 7–25)
CO2: 30 mmol/L (ref 20–32)
Calcium: 9.8 mg/dL (ref 8.6–10.3)
Chloride: 106 mmol/L (ref 98–110)
Creat: 1.03 mg/dL (ref 0.70–1.25)
GFR, Est African American: 90 mL/min/{1.73_m2} (ref >=60)
GFR, Est Non African American: 78 mL/min/{1.73_m2} (ref >=60)
Globulin: 2.6 g/dL (calc) (ref 1.9–3.7)
Glucose, Bld: 94 mg/dL (ref 65–99)
Potassium: 4.1 mmol/L (ref 3.5–5.3)
Sodium: 143 mmol/L (ref 135–146)
Total Bilirubin: 0.5 mg/dL (ref 0.2–1.2)
Total Protein: 6.9 g/dL (ref 6.1–8.1)

## 2020-06-27 LAB — HIV-1 RNA QUANT-NO REFLEX-BLD
HIV 1 RNA Quant: NOT DETECTED Copies/mL
HIV-1 RNA Quant, Log: NOT DETECTED Log cps/mL

## 2020-06-27 LAB — RPR: RPR Ser Ql: REACTIVE — AB

## 2020-06-27 LAB — FLUORESCENT TREPONEMAL AB(FTA)-IGG-BLD: Fluorescent Treponemal ABS: REACTIVE — AB

## 2020-06-28 ENCOUNTER — Telehealth: Payer: Self-pay

## 2020-06-28 NOTE — Telephone Encounter (Signed)
RN spoke to patient to relay per Ethan Eke, NP that his viral load is undetectable, CD4 count is 799, and that his kidney and liver function and electrolytes are normal. Patient verbalized understanding and has no further questions.   Sandie Ano, RN

## 2020-06-28 NOTE — Telephone Encounter (Signed)
-----   Message from Veryl Speak, FNP sent at 06/28/2020  8:48 AM EDT ----- Please inform Ethan Farrell that his viral load is undetectable and CD4 count is 799. Kidney function, liver function and electrolytes are normal.

## 2020-12-31 ENCOUNTER — Encounter: Payer: Self-pay | Admitting: Family

## 2020-12-31 ENCOUNTER — Ambulatory Visit (INDEPENDENT_AMBULATORY_CARE_PROVIDER_SITE_OTHER): Payer: Commercial Managed Care - PPO | Admitting: Family

## 2020-12-31 ENCOUNTER — Other Ambulatory Visit: Payer: Self-pay

## 2020-12-31 VITALS — BP 137/82 | HR 65 | Temp 97.7°F | Wt 241.0 lb

## 2020-12-31 DIAGNOSIS — B20 Human immunodeficiency virus [HIV] disease: Secondary | ICD-10-CM | POA: Diagnosis not present

## 2020-12-31 DIAGNOSIS — A539 Syphilis, unspecified: Secondary | ICD-10-CM | POA: Diagnosis not present

## 2020-12-31 DIAGNOSIS — Z0000 Encounter for general adult medical examination without abnormal findings: Secondary | ICD-10-CM

## 2020-12-31 MED ORDER — BIKTARVY 50-200-25 MG PO TABS: 1.0000 | ORAL_TABLET | Freq: Every day | ORAL | 5 refills | Status: DC

## 2020-12-31 NOTE — Assessment & Plan Note (Signed)
Mr. Simien continues to have well-controlled virus with good adherence and tolerance to his ART regimen of Biktarvy.  No signs/symptoms of opportunistic infection.  We reviewed previous lab work and discussed plan of care.  Continue current dose of Biktarvy.  Check blood work today.  Plan for follow-up in 6 months or sooner if needed with lab work on the same day.

## 2020-12-31 NOTE — Patient Instructions (Addendum)
Nice to see you.   We will check your lab work today.  Continue to take your medication daily.   Refills have been sent to the pharmacy.   Plan for follow up in 6 months or sooner if needed with lab work on the same day.  Have a great day and stay safe! 

## 2020-12-31 NOTE — Assessment & Plan Note (Signed)
Previous RPR titer of 1: 1.  No current symptoms.  Recheck RPR for routine monitoring.

## 2020-12-31 NOTE — Assessment & Plan Note (Signed)
   Discussed importance of safe sexual practices and condom usage.  Condoms offered.  Declines tetanus vaccine.  Will schedule dental care independently.

## 2020-12-31 NOTE — Progress Notes (Signed)
Brief Narrative   Patient ID: Ethan Farrell, male    DOB: Oct 21, 1960, 60 y.o.   MRN: [id number]  Ethan Farrell is a 60 y/o caucasian male with HIV disease diagnosed in June 2020 with risk factor of MSM.  Initial CD4 count and viral load unavailable. Clinic entry with viral load of 239 and CD4 count of 568. No genotype performed. No history of opportunistic infection. No history of opportunistic infection. [id number] negative. Sole medication regimen of Biktarvy.   Subjective:    Chief Complaint  Patient presents with   HIV Positive/AIDS   HPI:  Ethan Farrell is a 60 y.o. male with HIV disease last seen on 06/25/2020 with well-controlled virus and good adherence and tolerance to his ART regimen of Biktarvy. Viral load at the time was undetectable with CD4 count of 799. Here today for routine follow up.  Ethan Farrell continues to take his Biktarvy daily as prescribed with no adverse side effects or missed doses.  Overall feeling well today with no new concerns/complaints.  Has questions about scheduling a dentist appointment. Denies fevers, chills, night sweats, headaches, changes in vision, neck pain/stiffness, nausea, diarrhea, vomiting, lesions or rashes.  Ethan Farrell has no problems obtaining medication from the pharmacy. Denies feelings of being down, depressed or hopeless recently. No current recreational or illicit drug use or alcohol consumption. Continue to smoke between 1/2-3/4 pack of cigarettes per day. Recently had Covid. Healthcare maintenance due includes tetanus. Condoms offered.   Allergies  Allergen Reactions   Prednisone     Itching, flushing   Sulfa Antibiotics     Childhood > rash      Outpatient Medications Prior to Visit  Medication Sig Dispense Refill   aspirin 81 MG chewable tablet Chew by mouth.     atorvastatin (LIPITOR) 80 MG tablet 1 tab by mouth at bedtime     gabapentin (NEURONTIN) 300 MG capsule Take 300 mg by mouth 3 (three) times daily.      levothyroxine (SYNTHROID) 112 MCG tablet Take 1 tablet (112 mcg total) by mouth daily before breakfast. 90 tablet 1   levothyroxine (SYNTHROID) 125 MCG tablet Take 125 mcg by mouth every morning.     lisinopril (ZESTRIL) 10 MG tablet Take 1 tablet by mouth daily. 90 tablet 1   meloxicam (MOBIC) 15 MG tablet Take 15 mg by mouth daily.     metFORMIN (GLUCOPHAGE) 1000 MG tablet Take 1,000 mg by mouth daily.     metoprolol succinate (TOPROL-XL) 25 MG 24 hr tablet Take 1 tablet by mouth daily.     MOUNJARO 10 MG/0.5ML Pen Inject into the skin once a week.     valACYclovir (VALTREX) 1000 MG tablet Take 1 tablet (1,000 mg total) by mouth 2 (two) times daily. 10 tablet 2   VYVANSE 30 MG capsule Take 30 mg by mouth every morning.     bictegravir-emtricitabine-tenofovir AF (BIKTARVY) 50-200-25 MG TABS tablet Take 1 tablet by mouth daily. 30 tablet 5   TRULICITY 0.75 MG/0.5ML SOPN Inject 1 Syringe into the skin once a week.     No facility-administered medications prior to visit.     Past Medical History:  Diagnosis Date   Asthma    DM type 2 (diabetes mellitus, type 2) (HCC)    Heart attack (HCC) 11/2009   2 stents   HIV infection (HCC)    HTN (hypertension)    Hypercholesterolemia    OSA (obstructive sleep apnea)    Seasonal allergies  Past Surgical History:  Procedure Laterality Date   BACK SURGERY     CARPAL TUNNEL RELEASE Left    KNEE SURGERY     left   right shoulder     right shoulder spurs   ROTATOR CUFF REPAIR     right   UMBILICAL HERNIA REPAIR        Review of Systems  Constitutional:  Negative for appetite change, chills, fatigue, fever and unexpected weight change.  Eyes:  Negative for visual disturbance.  Respiratory:  Negative for cough, chest tightness, shortness of breath and wheezing.   Cardiovascular:  Negative for chest pain and leg swelling.  Gastrointestinal:  Negative for abdominal pain, constipation, diarrhea, nausea and vomiting.  Genitourinary:   Negative for dysuria, flank pain, frequency, genital sores, hematuria and urgency.  Skin:  Negative for rash.  Allergic/Immunologic: Negative for immunocompromised state.  Neurological:  Negative for dizziness and headaches.     Objective:    BP 137/82   Pulse 65   Temp 97.7 F (36.5 C) (Oral)   Wt 241 lb (109.3 kg)  Nursing note and vital signs reviewed.  Physical Exam Constitutional:      General: He is not in acute distress.    Appearance: He is well-developed.  Eyes:     Conjunctiva/sclera: Conjunctivae normal.  Cardiovascular:     Rate and Rhythm: Normal rate and regular rhythm.     Heart sounds: Normal heart sounds. No murmur heard.   No friction rub. No gallop.  Pulmonary:     Effort: Pulmonary effort is normal. No respiratory distress.     Breath sounds: Normal breath sounds. No wheezing or rales.  Chest:     Chest wall: No tenderness.  Abdominal:     General: Bowel sounds are normal.     Palpations: Abdomen is soft.     Tenderness: There is no abdominal tenderness.  Musculoskeletal:     Cervical back: Neck supple.  Lymphadenopathy:     Cervical: No cervical adenopathy.  Skin:    General: Skin is warm and dry.     Findings: No rash.  Neurological:     Mental Status: He is alert and oriented to person, place, and time.  Psychiatric:        Behavior: Behavior normal.        Thought Content: Thought content normal.        Judgment: Judgment normal.     Depression screen [facility] 2/9 12/31/2020 06/25/2020 02/28/2020 08/21/2019 03/21/2019  Decreased Interest 0 0 0 0 0  Down, Depressed, Hopeless 0 0 0 0 0  PHQ - 2 Score 0 0 0 0 0       Assessment & Plan:    Patient Active Problem List   Diagnosis Date Noted   Syphilis 02/29/2020   HSV infection 08/21/2019   Healthcare maintenance 11/08/2018   HIV disease (HCC) 10/04/2018   Type 2 diabetes mellitus without complications (HCC) 10/04/2018   Hypothyroidism 10/04/2018   Hypertension 10/04/2018   Bilateral  pulmonary infiltrates on chest x-ray 07/24/2010   Pneumonitis, hypersensitivity (HCC) 07/24/2010   OSA on CPAP 07/24/2010     Problem List Items Addressed This Visit       Other   HIV disease [facility])    Ethan Farrell continues to have well-controlled virus with good adherence and tolerance to his ART regimen of Biktarvy.  No signs/symptoms of opportunistic infection.  We reviewed previous lab work and discussed plan of care.  Continue current dose of  Biktarvy.  Check blood work today.  Plan for follow-up in 6 months or sooner if needed with lab work on the same day.      Relevant Medications   bictegravir-emtricitabine-tenofovir AF (BIKTARVY) 50-200-25 MG TABS tablet   Other Relevant Orders   COMPLETE METABOLIC PANEL WITH GFR   HIV-1 RNA quant-no reflex-bld   T-helper cell (CD4)- ([facility] clinic only)   Healthcare maintenance    Discussed importance of safe sexual practices and condom usage.  Condoms offered. Declines tetanus vaccine. Will schedule dental care independently.      Syphilis - Primary   Relevant Medications   bictegravir-emtricitabine-tenofovir AF (BIKTARVY) 50-200-25 MG TABS tablet   Other Relevant Orders   RPR     I have discontinued De Nurse. Kaltenbach's Trulicity. I am also having him maintain his atorvastatin, aspirin, levothyroxine, lisinopril, levothyroxine, metFORMIN, valACYclovir, Mounjaro, metoprolol succinate, meloxicam, Vyvanse, gabapentin, and Biktarvy.   Meds ordered this encounter  Medications   bictegravir-emtricitabine-tenofovir AF (BIKTARVY) 50-200-25 MG TABS tablet    Sig: Take 1 tablet by mouth daily.    Dispense:  30 tablet    Refill:  5    Order Specific Question:   Supervising Provider    Answer:   Judyann Munson [[id number]]     Follow-up: Return in about 6 months (around 07/01/2021), or if symptoms worsen or fail to improve.   Marcos Eke, MSN, FNP-C Nurse Practitioner [facility] for Infectious Disease [facility]  Main number: [phone]

## 2021-01-01 NOTE — Addendum Note (Signed)
Addended by: Jeanine Luz D on: 01/01/2021 02:16 PM   Modules accepted: Level of Service

## 2021-01-02 LAB — COMPLETE METABOLIC PANEL WITH GFR
AG Ratio: 1.8 (calc) (ref 1.0–2.5)
ALT: 10 U/L (ref 9–46)
AST: 14 U/L (ref 10–35)
Albumin: 4.3 g/dL (ref 3.6–5.1)
Alkaline phosphatase (APISO): 72 U/L (ref 35–144)
BUN: 17 mg/dL (ref 7–25)
CO2: 26 mmol/L (ref 20–32)
Calcium: 9.8 mg/dL (ref 8.6–10.3)
Chloride: 106 mmol/L (ref 98–110)
Creat: 1.08 mg/dL (ref 0.70–1.35)
Globulin: 2.4 g/dL (calc) (ref 1.9–3.7)
Glucose, Bld: 84 mg/dL (ref 65–99)
Potassium: 4.4 mmol/L (ref 3.5–5.3)
Sodium: 143 mmol/L (ref 135–146)
Total Bilirubin: 0.6 mg/dL (ref 0.2–1.2)
Total Protein: 6.7 g/dL (ref 6.1–8.1)
eGFR: 78 mL/min/{1.73_m2} (ref >=60)

## 2021-01-02 LAB — HELPER T-LYMPH-CD4 (ARMC ONLY)
% CD 4 Pos. Lymph.: 38.4 % (ref 30.8–58.5)
Absolute CD 4 Helper: 922 /uL (ref 359–1519)
Basophils Absolute: 0 10*3/uL (ref 0.0–0.2)
Basos: 1 %
EOS (ABSOLUTE): 0.5 10*3/uL — ABNORMAL HIGH (ref 0.0–0.4)
Eos: 8 %
Hematocrit: 43.7 % (ref 37.5–51.0)
Hemoglobin: 15.4 g/dL (ref 13.0–17.7)
Immature Grans (Abs): 0 10*3/uL (ref 0.0–0.1)
Immature Granulocytes: 0 %
Lymphocytes Absolute: 2.4 10*3/uL (ref 0.7–3.1)
Lymphs: 36 %
MCH: 34.1 pg — ABNORMAL HIGH (ref 26.6–33.0)
MCHC: 35.2 g/dL (ref 31.5–35.7)
MCV: 97 fL (ref 79–97)
Monocytes Absolute: 0.6 10*3/uL (ref 0.1–0.9)
Monocytes: 9 %
Neutrophils Absolute: 3.1 10*3/uL (ref 1.4–7.0)
Neutrophils: 46 %
Platelets: 195 10*3/uL (ref 150–450)
RBC: 4.51 x10E6/uL (ref 4.14–5.80)
RDW: 12.7 % (ref 11.6–15.4)
WBC: 6.6 10*3/uL (ref 3.4–10.8)

## 2021-01-02 LAB — HIV-1 RNA QUANT-NO REFLEX-BLD
HIV 1 RNA Quant: <20 Copies/mL — ABNORMAL HIGH
HIV-1 RNA Quant, Log: <1.3 Log cps/mL — ABNORMAL HIGH

## 2021-01-02 LAB — RPR: RPR Ser Ql: NONREACTIVE

## 2021-04-16 ENCOUNTER — Other Ambulatory Visit (HOSPITAL_COMMUNITY): Payer: Self-pay

## 2021-04-21 ENCOUNTER — Other Ambulatory Visit (HOSPITAL_COMMUNITY): Payer: Self-pay

## 2021-07-14 ENCOUNTER — Other Ambulatory Visit: Payer: Self-pay

## 2021-07-14 ENCOUNTER — Encounter: Payer: Self-pay | Admitting: Family

## 2021-07-14 ENCOUNTER — Ambulatory Visit (INDEPENDENT_AMBULATORY_CARE_PROVIDER_SITE_OTHER): Payer: Commercial Managed Care - PPO | Admitting: Family

## 2021-07-14 VITALS — BP 151/82 | HR 67 | Temp 98.0°F | Wt 224.0 lb

## 2021-07-14 DIAGNOSIS — B20 Human immunodeficiency virus [HIV] disease: Secondary | ICD-10-CM | POA: Diagnosis not present

## 2021-07-14 DIAGNOSIS — R1030 Lower abdominal pain, unspecified: Secondary | ICD-10-CM | POA: Diagnosis not present

## 2021-07-14 DIAGNOSIS — Z0000 Encounter for general adult medical examination without abnormal findings: Secondary | ICD-10-CM | POA: Diagnosis not present

## 2021-07-14 MED ORDER — BIKTARVY 50-200-25 MG PO TABS: 1.0000 | ORAL_TABLET | Freq: Every day | ORAL | 5 refills | Status: DC

## 2021-07-14 NOTE — Patient Instructions (Signed)
Nice to see you. ? ?We will check your lab work today. ? ?Continue to take your medication daily as prescribed. ? ?Refills have been sent to the pharmacy. ? ?Plan for follow up in 6 months or sooner if needed with lab work on the same day. ? ?Have a great day and stay safe! ? ?

## 2021-07-14 NOTE — Assessment & Plan Note (Signed)
Ethan Farrell has acute onset lower abdominal suspected musculoskeletal in origin given lifting heave furniture. Improving currently. No evidence of hernia. Discussed proper lifting techniques and signs/symptoms to monitor for to seek additional care.  ?

## 2021-07-14 NOTE — Progress Notes (Signed)
**Note Ethan-Identified via Obfuscation** ? ? ?Brief Narrative  ? ?Patient ID: Ethan Farrell, male    DOB: 1960/10/28, 61 y.o.   MRN: [id number] ? ?Ethan Farrell is a 61 y/o caucasian male with HIV disease diagnosed in June 2020 with risk factor of MSM.  Initial CD4 count and viral load unavailable. Clinic entry with viral load of 239 and CD4 count of 568. No genotype performed. No history of opportunistic infection. No history of opportunistic infection. [id number] negative. Sole medication regimen of Biktarvy.  ? ?Subjective:  ?  ?Chief Complaint  ?Patient presents with  ? Follow-up  ? HIV Positive/AIDS  ? ? ?HPI: ? ?Ethan Farrell is a 61 y.o. male with HIV disease last seen on 01/01/2019 with well-controlled virus and good adherence and tolerance to his ART regimen of Biktarvy.Viral load was undetectable with CD4 count of 922.  Kidney function, liver function, electrolytes within normal ranges.  Here today for routine follow-up. ? ?Ethan Farrell continues to take his Biktarvy daily as prescribed with no adverse side effects.  Overall feeling well today with concern for lower abdominal pain starting several days ago when he was lifting furniture and has concern for possible urinary tract infection versus muscle strain/hernia.  No current urinary symptoms. Denies fevers, chills, night sweats, headaches, changes in vision, neck pain/stiffness, nausea, diarrhea, vomiting, lesions or rashes. ? ?Ethan Farrell has no problems obtaining medication from the pharmacy.  Denies feelings of being down, depressed, or hopeless recently.  No current recreational or illicit drug use or alcohol consumption and continues to do smoke tobacco some days.  Condoms offered.  Healthcare maintenance due includes tetanus. ? ?Allergies  ?Allergen Reactions  ? Prednisone   ?  Itching, flushing  ? Sulfa Antibiotics   ?  Childhood > rash  ? ? ? ? ?Outpatient Medications Prior to Visit  ?Medication Sig Dispense Refill  ? aspirin 81 MG chewable tablet Chew by mouth.    ? atorvastatin (LIPITOR)  80 MG tablet 1 tab by mouth at bedtime    ? gabapentin (NEURONTIN) 300 MG capsule Take 300 mg by mouth 3 (three) times daily.    ? levothyroxine (SYNTHROID) 112 MCG tablet Take 1 tablet (112 mcg total) by mouth daily before breakfast. 90 tablet 1  ? levothyroxine (SYNTHROID) 125 MCG tablet Take 125 mcg by mouth every morning.    ? lisinopril (ZESTRIL) 10 MG tablet Take 1 tablet by mouth daily. 90 tablet 1  ? meloxicam (MOBIC) 15 MG tablet Take 15 mg by mouth daily.    ? metFORMIN (GLUCOPHAGE) 1000 MG tablet Take 1,000 mg by mouth daily.    ? metoprolol succinate (TOPROL-XL) 25 MG 24 hr tablet Take 1 tablet by mouth daily.    ? MOUNJARO 10 MG/0.5ML Pen Inject into the skin once a week.    ? valACYclovir (VALTREX) 1000 MG tablet Take 1 tablet (1,000 mg total) by mouth 2 (two) times daily. 10 tablet 2  ? VYVANSE 30 MG capsule Take 30 mg by mouth every morning.    ? bictegravir-emtricitabine-tenofovir AF (BIKTARVY) 50-200-25 MG TABS tablet Take 1 tablet by mouth daily. 30 tablet 5  ? ?No facility-administered medications prior to visit.  ? ? ? ?Past Medical History:  ?Diagnosis Date  ? Asthma   ? DM type 2 (diabetes mellitus, type 2) (HCC)   ? Heart attack (HCC) 11/2009  ? 2 stents  ? HIV infection (HCC)   ? HTN (hypertension)   ? Hypercholesterolemia   ? OSA (obstructive sleep apnea)   ?  Seasonal allergies   ? ? ? ?Past Surgical History:  ?Procedure Laterality Date  ? BACK SURGERY    ? CARPAL TUNNEL RELEASE Left   ? KNEE SURGERY    ? left  ? right shoulder    ? right shoulder spurs  ? ROTATOR CUFF REPAIR    ? right  ? UMBILICAL HERNIA REPAIR    ? ? ? ? ?Review of Systems  ?Constitutional:  Negative for appetite change, chills, fatigue, fever and unexpected weight change.  ?Eyes:  Negative for visual disturbance.  ?Respiratory:  Negative for cough, chest tightness, shortness of breath and wheezing.   ?Cardiovascular:  Negative for chest pain and leg swelling.  ?Gastrointestinal:  Negative for abdominal pain,  constipation, diarrhea, nausea and vomiting.  ?Genitourinary:  Negative for dysuria, flank pain, frequency, genital sores, hematuria and urgency.  ?Skin:  Negative for rash.  ?Allergic/Immunologic: Negative for immunocompromised state.  ?Neurological:  Negative for dizziness and headaches.  ?   ?Objective:  ?  ?BP (!) 151/82   Pulse 67   Temp 98 ?F (36.7 ?C) (Temporal)   Wt 224 lb (101.6 kg)   SpO2 97%  ?Nursing note and vital signs reviewed. ? ?Physical Exam ?Constitutional:   ?   General: He is not in acute distress. ?   Appearance: He is well-developed.  ?Eyes:  ?   Conjunctiva/sclera: Conjunctivae normal.  ?Cardiovascular:  ?   Rate and Rhythm: Normal rate and regular rhythm.  ?   Heart sounds: Normal heart sounds. No murmur heard. ?  No friction rub. No gallop.  ?Pulmonary:  ?   Effort: Pulmonary effort is normal. No respiratory distress.  ?   Breath sounds: Normal breath sounds. No wheezing or rales.  ?Chest:  ?   Chest wall: No tenderness.  ?Abdominal:  ?   General: Bowel sounds are normal.  ?   Palpations: Abdomen is soft.  ?   Tenderness: There is no abdominal tenderness.  ?Musculoskeletal:  ?   Cervical back: Neck supple.  ?Lymphadenopathy:  ?   Cervical: No cervical adenopathy.  ?Skin: ?   General: Skin is warm and dry.  ?   Findings: No rash.  ?Neurological:  ?   Mental Status: He is alert and oriented to person, place, and time.  ?Psychiatric:     ?   Behavior: Behavior normal.     ?   Thought Content: Thought content normal.     ?   Judgment: Judgment normal.  ? ? ? ? ?  12/31/2020  ?  4:25 PM 06/25/2020  ?  4:04 PM 02/28/2020  ?  4:13 PM 08/21/2019  ?  4:20 PM 03/21/2019  ?  4:34 PM  ?Depression screen PHQ 2/9  ?Decreased Interest 0 0 0 0 0  ?Down, Depressed, Hopeless 0 0 0 0 0  ?PHQ - 2 Score 0 0 0 0 0  ?  ?   ?Assessment & Plan:  ? ? ?Patient Active Problem List  ? Diagnosis Date Noted  ? Lower abdominal pain 07/14/2021  ? Syphilis 02/29/2020  ? 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 Healthcare maintenance  11/08/2018  ? HIV disease (HCC) 10/04/2018  ? Type 2 diabetes mellitus without complications (HCC) 10/04/2018  ? Hypothyroidism 10/04/2018  ? Hypertension 10/04/2018  ? Bilateral pulmonary infiltrates on chest x-ray 07/24/2010  ? Pneumonitis, hypersensitivity (HCC) 07/24/2010  ? OSA on CPAP 07/24/2010  ? ? ? ?Problem List Items Addressed This Visit   ? ?  ? Other  ?  HIV disease (HCC) - Primary  ?  Ethan Farrell continues to have well-controlled virus with good adherence and tolerance to his ART regimen of Biktarvy.  We reviewed previous lab work and discussed plan of care.  Check blood work today.  Continue current dose of Biktarvy.  Plan for follow-up in 6 months or sooner if needed with lab work on the same day. ? ?  ?  ? Relevant Medications  ? bictegravir-emtricitabine-tenofovir AF (BIKTARVY) 50-200-25 MG TABS tablet  ? Other Relevant Orders  ? Comprehensive metabolic panel  ? HIV-1 RNA quant-no reflex-bld  ? T-helper cell (CD4)- ([facility] clinic only)  ? Healthcare maintenance  ?  Discussed importance of safe sexual practices and condom use.  Condoms offered. ?Due for tetanus vaccination which was declined. ?Encouraged to complete routine dental care independently with commercial insurance.  Can refer to [facility] if needed.  ?  ?  ? Lower abdominal pain  ?  Ethan Farrell has acute onset lower abdominal suspected musculoskeletal in origin given lifting heave furniture. Improving currently. No evidence of hernia. Discussed proper lifting techniques and signs/symptoms to monitor for to seek additional care.  ? ?  ?  ? ? ? ?I am having Ethan Farrell. Ethan Farrell maintain his atorvastatin, aspirin, levothyroxine, lisinopril, levothyroxine, metFORMIN, valACYclovir, Mounjaro, metoprolol succinate, meloxicam, Vyvanse, gabapentin, and Biktarvy. ? ? ?Meds ordered this encounter  ?Medications  ? bictegravir-emtricitabine-tenofovir AF (BIKTARVY) 50-200-25 MG TABS tablet  ?  Sig: Take 1 tablet by mouth daily.  ?  Dispense:  30 tablet  ?  Refill:   5  ?  Order Specific Question:   Supervising Provider  ?  Answer:   Judyann Munson [[id number]]  ? ? ? ?Follow-up: Return in about 6 months (around 01/13/2022). ? ? ?Marcos Eke, MSN, FNP-C ?[facility] Practition[facility]

## 2021-07-14 NOTE — Assessment & Plan Note (Signed)
?   Discussed importance of safe sexual practices and condom use.  Condoms offered. ?? Due for tetanus vaccination which was declined. ?? Encouraged to complete routine dental care independently with commercial insurance.  Can refer to [facility] if needed.  ?

## 2021-07-14 NOTE — Assessment & Plan Note (Signed)
Mr. Pignatelli continues to have well-controlled virus with good adherence and tolerance to his ART regimen of Biktarvy.  We reviewed previous lab work and discussed plan of care.  Check blood work today.  Continue current dose of Biktarvy.  Plan for follow-up in 6 months or sooner if needed with lab work on the same day. ?

## 2021-07-15 ENCOUNTER — Ambulatory Visit: Payer: Commercial Managed Care - PPO | Admitting: Family

## 2021-07-16 LAB — HELPER T-LYMPH-CD4 (ARMC ONLY)
% CD 4 Pos. Lymph.: 37.3 % (ref 30.8–58.5)
Absolute CD 4 Helper: 783 /uL (ref 359–1519)
Basophils Absolute: 0.1 10*3/uL (ref 0.0–0.2)
Basos: 1 %
EOS (ABSOLUTE): 0.5 10*3/uL — ABNORMAL HIGH (ref 0.0–0.4)
Eos: 9 %
Hematocrit: 41.2 % (ref 37.5–51.0)
Hemoglobin: 14.3 g/dL (ref 13.0–17.7)
Immature Grans (Abs): 0 10*3/uL (ref 0.0–0.1)
Immature Granulocytes: 0 %
Lymphocytes Absolute: 2.1 10*3/uL (ref 0.7–3.1)
Lymphs: 36 %
MCH: 33.4 pg — ABNORMAL HIGH (ref 26.6–33.0)
MCHC: 34.7 g/dL (ref 31.5–35.7)
MCV: 96 fL (ref 79–97)
Monocytes Absolute: 0.5 10*3/uL (ref 0.1–0.9)
Monocytes: 8 %
Neutrophils Absolute: 2.7 10*3/uL (ref 1.4–7.0)
Neutrophils: 46 %
Platelets: 182 10*3/uL (ref 150–450)
RBC: 4.28 x10E6/uL (ref 4.14–5.80)
RDW: 12.7 % (ref 11.6–15.4)
WBC: 5.8 10*3/uL (ref 3.4–10.8)

## 2021-07-16 LAB — T-HELPER CELL (CD4) - (RCID CLINIC ONLY)

## 2021-07-19 LAB — COMPREHENSIVE METABOLIC PANEL
AG Ratio: 1.7 (calc) (ref 1.0–2.5)
ALT: 10 U/L (ref 9–46)
AST: 15 U/L (ref 10–35)
Albumin: 4.3 g/dL (ref 3.6–5.1)
Alkaline phosphatase (APISO): 82 U/L (ref 35–144)
BUN: 12 mg/dL (ref 7–25)
CO2: 27 mmol/L (ref 20–32)
Calcium: 9.2 mg/dL (ref 8.6–10.3)
Chloride: 106 mmol/L (ref 98–110)
Creat: 0.95 mg/dL (ref 0.70–1.35)
Globulin: 2.6 g/dL (calc) (ref 1.9–3.7)
Glucose, Bld: 80 mg/dL (ref 65–99)
Potassium: 3.8 mmol/L (ref 3.5–5.3)
Sodium: 141 mmol/L (ref 135–146)
Total Bilirubin: 0.7 mg/dL (ref 0.2–1.2)
Total Protein: 6.9 g/dL (ref 6.1–8.1)

## 2021-07-19 LAB — HIV-1 RNA QUANT-NO REFLEX-BLD
HIV 1 RNA Quant: NOT DETECTED copies/mL
HIV-1 RNA Quant, Log: NOT DETECTED Log copies/mL

## 2021-07-21 ENCOUNTER — Telehealth: Payer: Self-pay

## 2021-07-21 NOTE — Telephone Encounter (Signed)
-----   Message from [place], [place] sent at 07/21/2021 11:25 AM EDT ----- ?Please inform Ethan Farrell that his lab work shows undetectable viral load and CD4 count of 783. Kidney function, liver function, and electrolytes within normal ranges.  ?

## 2021-07-21 NOTE — Telephone Encounter (Signed)
Patient aware of results.   Read Bonelli P Nickia Boesen, CMA  

## 2021-11-18 DIAGNOSIS — K388 Other specified diseases of appendix: Secondary | ICD-10-CM | POA: Insufficient documentation

## 2021-11-18 DIAGNOSIS — K4090 Unilateral inguinal hernia, without obstruction or gangrene, not specified as recurrent: Secondary | ICD-10-CM | POA: Insufficient documentation

## 2021-11-24 ENCOUNTER — Telehealth: Payer: Self-pay | Admitting: *Deleted

## 2021-11-24 NOTE — Telephone Encounter (Signed)
   Pre-operative Risk Assessment    Patient Name: Ethan Farrell  DOB: 12/18/1960 MRN: [id number]    PT WILL NEED NEW PT APPT. MESSAGE HAS BEEN SENT TO [facility] TO SCHEDULE A NEW PT APPT AS WELL AS FOR A REFERRAL TO CARDIOLOGY IS IN PLACE.   Request for Surgical Clearance    Procedure:   RIGHT INGUINAL HERNIA REPAIR   Date of Surgery:  Clearance TBD                                 Surgeon:  DR. Georgiana Shore Surgeon's Group or Practice Name:  [facility] [place] Phone number:  [phone] Fax number:  [phone]   Type of Clearance Requested:   - Medical ; ASA    Type of Anesthesia:  General    Additional requests/questions:    Elpidio Anis   11/24/2021, 2:36 PM

## 2021-11-28 NOTE — Telephone Encounter (Signed)
Patient is scheduled 12/02/2021 for his clearance appointment. His surgery is scheduled for 12/03/2021.

## 2021-11-28 NOTE — Telephone Encounter (Signed)
I will update the requesting office. This was the soonest we could get the pt in for new pt appt. Surgery may need to be pushed out a bit in lieu the pt will be seeing cardiologist as new pt 12/02/21. Possibility testing may be ordered by cardiologist.

## 2021-11-28 NOTE — Progress Notes (Unsigned)
Referring-Michael Lininger MD Reason for referral-preoperative evaluation prior to inguinal hernia repair  HPI: 61 year old male for preoperative evaluation prior to inguinal hernia repair at request of Terrilee Files MD.  Patient does have a history of coronary artery disease.  Patient had PCI of the right coronary artery in 2011.  Cardiac catheterization was performed August 2018 and patient was found to have severe stenosis in the proximal right coronary artery (80%), patent stents in the mid right coronary artery and otherwise nonobstructive coronary disease; ejection fraction 55%.  Patient had PCI of the right coronary artery with a drug-eluting stent.  Current Outpatient Medications  Medication Sig Dispense Refill   aspirin 81 MG chewable tablet Chew by mouth.     atorvastatin (LIPITOR) 80 MG tablet 1 tab by mouth at bedtime     bictegravir-emtricitabine-tenofovir AF (BIKTARVY) 50-200-25 MG TABS tablet Take 1 tablet by mouth daily. 30 tablet 5   gabapentin (NEURONTIN) 300 MG capsule Take 300 mg by mouth 3 (three) times daily.     levothyroxine (SYNTHROID) 112 MCG tablet Take 1 tablet (112 mcg total) by mouth daily before breakfast. 90 tablet 1   levothyroxine (SYNTHROID) 125 MCG tablet Take 125 mcg by mouth every morning.     lisinopril (ZESTRIL) 10 MG tablet Take 1 tablet by mouth daily. 90 tablet 1   meloxicam (MOBIC) 15 MG tablet Take 15 mg by mouth daily.     metFORMIN (GLUCOPHAGE) 1000 MG tablet Take 1,000 mg by mouth daily.     metoprolol succinate (TOPROL-XL) 25 MG 24 hr tablet Take 1 tablet by mouth daily.     MOUNJARO 10 MG/0.5ML Pen Inject into the skin once a week.     valACYclovir (VALTREX) 1000 MG tablet Take 1 tablet (1,000 mg total) by mouth 2 (two) times daily. 10 tablet 2   VYVANSE 30 MG capsule Take 30 mg by mouth every morning.     No current facility-administered medications for this visit.    Allergies  Allergen Reactions   Prednisone     Itching,  flushing   Sulfa Antibiotics     Childhood > rash     Past Medical History:  Diagnosis Date   Asthma    DM type 2 (diabetes mellitus, type 2) (HCC)    Heart attack (HCC) 11/2009   2 stents   HIV infection (HCC)    HTN (hypertension)    Hypercholesterolemia    OSA (obstructive sleep apnea)    Seasonal allergies     Past Surgical History:  Procedure Laterality Date   BACK SURGERY     CARPAL TUNNEL RELEASE Left    KNEE SURGERY     left   right shoulder     right shoulder spurs   ROTATOR CUFF REPAIR     right   UMBILICAL HERNIA REPAIR      Social History   Socioeconomic History   Marital status: Divorced    Spouse name: Not on file   Number of children: 2   Years of education: 14   Highest education level: Not on file  Occupational History   Occupation: Production designer, theatre/television/film at [employer]  Tobacco Use   Smoking status: Some Days    Packs/day: 0.50    Years: 15.00    Total pack years: 7.50    Types: Cigarettes   Smokeless tobacco: Never   Tobacco comments:    cut back from 2 packs to half pack/day  Vaping Use   Vaping Use: Never  used  Substance and Sexual Activity   Alcohol use: No   Drug use: No   Sexual activity: Yes    Partners: Male    Comment: declined condoms  Other Topics Concern   Not on file  Social History Narrative   Not on file   Social Determinants of Health   Financial Resource Strain: Not on file  Food Insecurity: Not on file  Transportation Needs: Not on file  Physical Activity: Not on file  Stress: Not on file  Social Connections: Not on file  Intimate Partner Violence: Not on file    Family History  Problem Relation Age of Onset   Hypertension Father    Heart attack Paternal Grandfather    Heart failure Paternal Grandfather    Heart failure Paternal Grandmother    Heart attack Paternal Grandmother     ROS: no fevers or chills, productive cough, hemoptysis, dysphasia, odynophagia, melena, hematochezia, dysuria, hematuria, rash,  seizure activity, orthopnea, PND, pedal edema, claudication. Remaining systems are negative.  Physical Exam:   There were no vitals taken for this visit.  General:  Well developed/well nourished in NAD Skin warm/dry Patient not depressed No peripheral clubbing Back-normal HEENT-normal/normal eyelids Neck supple/normal carotid upstroke bilaterally; no bruits; no JVD; no thyromegaly chest - CTA/ normal expansion CV - RRR/normal S1 and S2; no murmurs, rubs or gallops;  PMI nondisplaced Abdomen -NT/ND, no HSM, no mass, + bowel sounds, no bruit 2+ femoral pulses, no bruits Ext-no edema, chords, 2+ DP Neuro-grossly nonfocal  ECG - personally reviewed  A/P  1 preoperative evaluation prior to right inguinal hernia repair-  2 coronary artery disease with history of PCI of the right coronary artery-continue aspirin and statin.  Aspirin can be held prior to his surgery and resumed after.  3 hypertension-patient's blood pressure is controlled.  Continue present medical regimen.  4 hyperlipidemia-continue Lipitor at present dose.  5 obstructive sleep apnea-continue CPAP.  Olga Millers, MD

## 2021-11-28 NOTE — Telephone Encounter (Signed)
Left message to call back to schedule a new pt appt for pre op clearance.

## 2021-11-28 NOTE — Telephone Encounter (Signed)
NEW PT APPT TO BE IN OFFICE

## 2021-12-02 ENCOUNTER — Ambulatory Visit: Payer: Commercial Managed Care - PPO | Attending: Cardiology | Admitting: Cardiology

## 2021-12-02 ENCOUNTER — Encounter: Payer: Self-pay | Admitting: Cardiology

## 2021-12-02 VITALS — BP 140/70 | HR 63 | Ht 73.0 in | Wt 229.4 lb

## 2021-12-02 DIAGNOSIS — R0989 Other specified symptoms and signs involving the circulatory and respiratory systems: Secondary | ICD-10-CM

## 2021-12-02 DIAGNOSIS — I2510 Atherosclerotic heart disease of native coronary artery without angina pectoris: Secondary | ICD-10-CM

## 2021-12-02 DIAGNOSIS — I10 Essential (primary) hypertension: Secondary | ICD-10-CM

## 2021-12-02 DIAGNOSIS — R011 Cardiac murmur, unspecified: Secondary | ICD-10-CM | POA: Diagnosis not present

## 2021-12-02 DIAGNOSIS — Z01810 Encounter for preprocedural cardiovascular examination: Secondary | ICD-10-CM

## 2021-12-02 MED ORDER — LISINOPRIL 20 MG PO TABS: ORAL_TABLET | ORAL | 3 refills | Status: DC

## 2021-12-02 NOTE — Patient Instructions (Signed)
Medication Instructions:   INCREASE LISINOPRIL TO 20 MG ONCE DAILY= 2 OF THE 10 MG TABLETS ONCE DAILY  *If you need a refill on your cardiac medications before your next appointment, please call your pharmacy*   Lab Work:  Your physician recommends that you return for lab work in: ONE WEEK-DO NOT NEED TO FAST  If you have labs (blood work) drawn today and your tests are completely normal, you will receive your results only by: MyChart Message (if you have MyChart) OR A paper copy in the mail If you have any lab test that is abnormal or we need to change your treatment, we will call you to review the results.   Testing/Procedures:  Your physician has requested that you have an echocardiogram. Echocardiography is a painless test that uses sound waves to create images of your heart. It provides your doctor with information about the size and shape of your heart and how well your heart's chambers and valves are working. This procedure takes approximately one hour. There are no restrictions for this procedure. [address] [facility]  Your physician has requested that you have a carotid duplex. This test is an ultrasound of the carotid arteries in your neck. It looks at blood flow through these arteries that supply the brain with blood. Allow one hour for this exam. There are no restrictions or special instructions. [facility]   Follow-Up: At [facility], you and your health needs are our priority.  As part of our continuing mission to provide you with exceptional heart care, we have created designated Provider Care Teams.  These Care Teams include your primary Cardiologist (physician) and Advanced Practice Providers (APPs -  Physician Assistants and Nurse Practitioners) who all work together to provide you with the care you need, when you need it.  We recommend signing up for the patient portal called "MyChart".  Sign up information is provided on this After Visit Summary.   MyChart is used to connect with patients for Virtual Visits (Telemedicine).  Patients are able to view lab/test results, encounter notes, upcoming appointments, etc.  Non-urgent messages can be sent to your provider as well.   To learn more about what you can do with MyChart, go to [email or web address].    Your next appointment:   12 month(s)  The format for your next appointment:   In Person  Provider:   Olga Millers MD

## 2021-12-15 ENCOUNTER — Ambulatory Visit (HOSPITAL_BASED_OUTPATIENT_CLINIC_OR_DEPARTMENT_OTHER)
Admission: RE | Admit: 2021-12-15 | Discharge: 2021-12-15 | Disposition: A | Discharge status: Home / Self Care | Payer: Commercial Managed Care - PPO | Source: Ambulatory Visit | Attending: Cardiology | Admitting: Cardiology

## 2021-12-15 ENCOUNTER — Encounter: Payer: Self-pay | Admitting: *Deleted

## 2021-12-15 ENCOUNTER — Ambulatory Visit (HOSPITAL_COMMUNITY)
Admission: RE | Admit: 2021-12-15 | Payer: Commercial Managed Care - PPO | Source: Ambulatory Visit | Attending: Cardiology | Admitting: Cardiology

## 2021-12-15 ENCOUNTER — Other Ambulatory Visit (HOSPITAL_BASED_OUTPATIENT_CLINIC_OR_DEPARTMENT_OTHER): Payer: Self-pay | Admitting: Cardiology

## 2021-12-15 DIAGNOSIS — R0989 Other specified symptoms and signs involving the circulatory and respiratory systems: Secondary | ICD-10-CM

## 2021-12-15 DIAGNOSIS — R011 Cardiac murmur, unspecified: Secondary | ICD-10-CM | POA: Insufficient documentation

## 2021-12-15 LAB — ECHOCARDIOGRAM COMPLETE
AR max vel: 0.87 cm2
AV Area VTI: 0.83 cm2
AV Area mean vel: 0.84 cm2
AV Mean grad: 35 mmHg
AV Peak grad: 57.2 mmHg
Ao pk vel: 3.78 m/s
Area-P 1/2: 3.15 cm2
P 1/2 time: 568 msec
S' Lateral: 4.2 cm

## 2021-12-15 NOTE — Progress Notes (Signed)
  Echocardiogram 2D Echocardiogram has been performed.  Merrie Roof F 12/15/2021, 11:43 AM

## 2021-12-19 ENCOUNTER — Telehealth: Payer: Self-pay | Admitting: Cardiology

## 2021-12-19 NOTE — Telephone Encounter (Signed)
Attempted to call patient, left message for patient to call back to office.   

## 2021-12-19 NOTE — Telephone Encounter (Signed)
Patient was returning call for results. Please advise °

## 2021-12-23 NOTE — Telephone Encounter (Signed)
Left message for patient per CT results to start aspirin 81 mg once daily

## 2021-12-27 ENCOUNTER — Other Ambulatory Visit: Payer: Self-pay | Admitting: Family

## 2021-12-27 DIAGNOSIS — B20 Human immunodeficiency virus [HIV] disease: Secondary | ICD-10-CM

## 2022-01-26 NOTE — Progress Notes (Deleted)
HPI: Follow-up CAD and AS.  Patient does have a history of coronary artery disease.  Patient had PCI of the right coronary artery in 2011.  Cardiac catheterization was performed August 2018 and patient was found to have severe stenosis in the proximal right coronary artery (80%), patent stents in the mid right coronary artery and otherwise nonobstructive coronary disease; ejection fraction 55%.  Patient had PCI of the right coronary artery with a drug-eluting stent.  Patient seen for preoperative evaluation September 2023.  Echocardiogram showed normal LV function, grade 2 diastolic dysfunction, mild to moderate left atrial enlargement, moderate to severe aortic stenosis with mean gradient 35 mmHg and mild aortic insufficiency.  Carotid Doppler September 2023 showed no hemodynamically significant stenosis bilaterally.  Current Outpatient Medications  Medication Sig Dispense Refill   aspirin 81 MG chewable tablet Chew by mouth.     atorvastatin (LIPITOR) 80 MG tablet 1 tab by mouth at bedtime     BIKTARVY 50-200-25 MG TABS tablet TAKE 1 TABLET BY MOUTH DAILY 30 tablet 0   gabapentin (NEURONTIN) 300 MG capsule Take 300 mg by mouth 3 (three) times daily.     levothyroxine (SYNTHROID) 125 MCG tablet Take 125 mcg by mouth every morning.     lisinopril (ZESTRIL) 20 MG tablet Take 1 tablet by mouth daily. 90 tablet 3   metFORMIN (GLUCOPHAGE) 1000 MG tablet Take 1,000 mg by mouth daily.     MOUNJARO 10 MG/0.5ML Pen Inject into the skin once a week.     No current facility-administered medications for this visit.     Past Medical History:  Diagnosis Date   Asthma    DM type 2 (diabetes mellitus, type 2) ([place])    Heart attack ([place]) 11/2009   2 stents   HIV infection ([place])    HTN (hypertension)    Hypercholesterolemia    Hypothyroid    OSA (obstructive sleep apnea)    Seasonal allergies     Past Surgical History:  Procedure Laterality Date   APPENDECTOMY     BACK SURGERY     CARPAL  TUNNEL RELEASE Left    KNEE SURGERY     left   right shoulder     right shoulder spurs   ROTATOR CUFF REPAIR     right   UMBILICAL HERNIA REPAIR      Social History   Socioeconomic History   Marital status: Divorced    Spouse name: Not on file   Number of children: 2   Years of education: 14   Highest education level: Not on file  Occupational History   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   Smoking status: Some Days    Packs/day: 0.50    Years: 15.00    Total pack years: 7.50    Types: Cigarettes   Smokeless tobacco: Never   Tobacco comments:    cut back from 2 packs to half pack/day  Vaping Use   Vaping Use: Never used  Substance and Sexual Activity   Alcohol use: No   Drug use: No   Sexual activity: Yes    Partners: Male    Comment: declined condoms  Other Topics Concern   Not on file  Social History Narrative   Not on file   Social Determinants of Health   Financial Resource Strain: Not on file  Food Insecurity: Not on file  Transportation Needs: Not on file  Physical Activity: Not on file  Stress: Not on file  Social Connections: Not on file  Intimate Partner Violence: Not on file    Family History  Problem Relation Age of Onset   Hypertension Father    Heart attack Paternal Grandfather    Heart failure Paternal Grandfather    Heart failure Paternal Grandmother    Heart attack Paternal Grandmother     ROS: no fevers or chills, productive cough, hemoptysis, dysphasia, odynophagia, melena, hematochezia, dysuria, hematuria, rash, seizure activity, orthopnea, PND, pedal edema, claudication. Remaining systems are negative.  Physical Exam: Well-developed well-nourished in no acute distress.  Skin is warm and dry.  HEENT is normal.  Neck is supple.  Chest is clear to auscultation with normal expansion.  Cardiovascular exam is regular rate and rhythm.  Abdominal exam nontender or distended. No masses palpated. Extremities show no  edema. neuro grossly intact  ECG- personally reviewed  A/P  1 aortic stenosis-  2 coronary artery disease-patient denies chest pain.  Continue aspirin and statin.  3 hypertension-blood pressure controlled.  Continue present medical regimen.  4 hyperlipidemia-continue statin.  5 obstructive sleep apnea-continue CPAP.  Olga Millers, MD

## 2022-02-02 ENCOUNTER — Ambulatory Visit: Payer: Commercial Managed Care - PPO | Attending: Cardiology | Admitting: Cardiology

## 2022-02-09 ENCOUNTER — Encounter: Payer: Self-pay | Admitting: Cardiology

## 2022-02-10 ENCOUNTER — Other Ambulatory Visit: Payer: Self-pay | Admitting: Family

## 2022-02-10 DIAGNOSIS — B20 Human immunodeficiency virus [HIV] disease: Secondary | ICD-10-CM

## 2022-02-12 ENCOUNTER — Telehealth: Payer: Self-pay

## 2022-02-12 NOTE — Telephone Encounter (Signed)
We received a PA from [facility] for patient's [place]. I have faxed the PA over to them along with office notes and labs. Patient also aware of Biktarvy needing a PA, but states he is not completely out of medication at this time.  Awaiting to a response.   [facility] Ph# [phone] [facility])/ [phone] Fax- [phone]

## 2022-02-23 NOTE — Telephone Encounter (Signed)
PA approved for the patient through 02/17/24 Tranika Scholler T Pricilla Loveless, CMA

## 2022-03-13 NOTE — Progress Notes (Unsigned)
Brief Narrative   Patient ID: Ethan Farrell, male    DOB: [birth year], 61 y.o.   MRN: [id number]  Mr. Cossey is a 61 y/o caucasian male with HIV disease diagnosed in June 2020 with risk factor of MSM.  Initial CD4 count and viral load unavailable. Clinic entry with viral load of 239 and CD4 count of 568. No genotype performed. No history of opportunistic infection. No history of opportunistic infection. [id number] negative. Sole medication regimen of Biktarvy.    Subjective:    No chief complaint on file.   HPI:  Ethan Farrell is a 61 y.o. male with HIV disease last seen on 07/14/21 with well controlled virus and good adherence and tolerance to Biktarvy. Viral load was undetectable and CD4 count 783. Renal function, liver function and electrolytes within normal ranges. Here today for routine follow up.    Allergies  Allergen Reactions   Prednisone     Itching, flushing   Sulfa Antibiotics     Childhood > rash      Outpatient Medications Prior to Visit  Medication Sig Dispense Refill   aspirin 81 MG chewable tablet Chew by mouth.     atorvastatin (LIPITOR) 80 MG tablet 1 tab by mouth at bedtime     BIKTARVY 50-200-25 MG TABS tablet TAKE 1 TABLET BY MOUTH DAILY 30 tablet 0   gabapentin (NEURONTIN) 300 MG capsule Take 300 mg by mouth 3 (three) times daily.     levothyroxine (SYNTHROID) 125 MCG tablet Take 125 mcg by mouth every morning.     lisinopril (ZESTRIL) 20 MG tablet Take 1 tablet by mouth daily. 90 tablet 3   metFORMIN (GLUCOPHAGE) 1000 MG tablet Take 1,000 mg by mouth daily.     MOUNJARO 10 MG/0.5ML Pen Inject into the skin once a week.     No facility-administered medications prior to visit.     Past Medical History:  Diagnosis Date   Asthma    DM type 2 (diabetes mellitus, type 2) (HCC)    Heart attack (HCC) 11/2009   2 stents   HIV infection (HCC)    HTN (hypertension)    Hypercholesterolemia    Hypothyroid    OSA (obstructive sleep apnea)    Seasonal  allergies      Past Surgical History:  Procedure Laterality Date   APPENDECTOMY     BACK SURGERY     CARPAL TUNNEL RELEASE Left    KNEE SURGERY     left   right shoulder     right shoulder spurs   ROTATOR CUFF REPAIR     right   UMBILICAL HERNIA REPAIR        Review of Systems    Objective:    There were no vitals taken for this visit. Nursing note and vital signs reviewed.  Physical Exam      12/31/2020    4:25 PM 06/25/2020    4:04 PM 02/28/2020    4:13 PM 08/21/2019    4:20 PM 03/21/2019    4:34 PM  Depression screen PHQ 2/9  Decreased Interest 0 0 0 0 0  Down, Depressed, Hopeless 0 0 0 0 0  PHQ - 2 Score 0 0 0 0 0       Assessment & Plan:    Patient Active Problem List   Diagnosis Date Noted   Lower abdominal pain 07/14/2021   Syphilis 02/29/2020   HSV infection 08/21/2019   Healthcare maintenance 11/08/2018   HIV disease (HCC)  10/04/2018   Type 2 diabetes mellitus without complications (HCC) 10/04/2018   Hypothyroidism 10/04/2018   Hypertension 10/04/2018   Bilateral pulmonary infiltrates on chest x-ray 07/24/2010   Pneumonitis, hypersensitivity (HCC) 07/24/2010   OSA on CPAP 07/24/2010     Problem List Items Addressed This Visit   None    I am having De Nurse. Walkup maintain his atorvastatin, aspirin, levothyroxine, metFORMIN, Mounjaro, gabapentin, lisinopril, and Biktarvy.   No orders of the defined types were placed in this encounter.    Follow-up: No follow-ups on file.   Marcos Eke, MSN, FNP-C Nurse Practitioner [facility] for Infectious Disease [facility] Main number: [phone]

## 2022-03-16 ENCOUNTER — Encounter: Payer: Self-pay | Admitting: Family

## 2022-03-16 ENCOUNTER — Ambulatory Visit (INDEPENDENT_AMBULATORY_CARE_PROVIDER_SITE_OTHER): Payer: Commercial Managed Care - PPO | Admitting: Family

## 2022-03-16 ENCOUNTER — Other Ambulatory Visit: Payer: Self-pay

## 2022-03-16 VITALS — BP 150/73 | HR 65 | Temp 97.3°F | Ht 73.0 in | Wt 230.0 lb

## 2022-03-16 DIAGNOSIS — B20 Human immunodeficiency virus [HIV] disease: Secondary | ICD-10-CM | POA: Diagnosis not present

## 2022-03-16 DIAGNOSIS — Z79899 Other long term (current) drug therapy: Secondary | ICD-10-CM | POA: Diagnosis not present

## 2022-03-16 DIAGNOSIS — Z0000 Encounter for general adult medical examination without abnormal findings: Secondary | ICD-10-CM | POA: Diagnosis not present

## 2022-03-16 MED ORDER — BIKTARVY 50-200-25 MG PO TABS: 1.0000 | ORAL_TABLET | Freq: Every day | ORAL | 6 refills | Status: DC

## 2022-03-16 NOTE — Assessment & Plan Note (Signed)
Tharon continues to have well controlled virus with good adherence and tolerance to [employer]. Reviewed lab work and discussed plan of care. Introduced [place] including risks, benefits, logistics, and monitoring. After discussion will stay with Biktarvy at this time. Check lab work. Continue current dose of Biktarvy. Plan for follow up in 6 months or sooner if needed with lab work on the same day.

## 2022-03-16 NOTE — Patient Instructions (Addendum)
Nice to see you.  We will check your lab work today.  Continue to take your medication daily as prescribed.  Refills have been sent to the pharmacy.  Plan for follow up in 6 months or sooner if needed with lab work same day.   Have a great day and stay safe!  

## 2022-03-16 NOTE — Assessment & Plan Note (Signed)
Discussed importance of safe sexual practice and condom use. Condoms and STD testing offered.  Declines vaccinations.  

## 2022-03-17 LAB — T-HELPER CELL (CD4) - (RCID CLINIC ONLY)
CD4 % Helper T Cell: 40 % (ref 33–65)
CD4 T Cell Abs: 725 /uL (ref 400–1790)

## 2022-03-18 LAB — COMPLETE METABOLIC PANEL WITH GFR
AG Ratio: 1.4 (calc) (ref 1.0–2.5)
ALT: 9 U/L (ref 9–46)
AST: 14 U/L (ref 10–35)
Albumin: 4 g/dL (ref 3.6–5.1)
Alkaline phosphatase (APISO): 83 U/L (ref 35–144)
BUN: 12 mg/dL (ref 7–25)
CO2: 29 mmol/L (ref 20–32)
Calcium: 9.3 mg/dL (ref 8.6–10.3)
Chloride: 107 mmol/L (ref 98–110)
Creat: 0.8 mg/dL (ref 0.70–1.35)
Globulin: 2.8 g/dL (calc) (ref 1.9–3.7)
Glucose, Bld: 92 mg/dL (ref 65–99)
Potassium: 3.7 mmol/L (ref 3.5–5.3)
Sodium: 143 mmol/L (ref 135–146)
Total Bilirubin: 0.4 mg/dL (ref 0.2–1.2)
Total Protein: 6.8 g/dL (ref 6.1–8.1)
eGFR: 100 mL/min/{1.73_m2} (ref >=60)

## 2022-03-18 LAB — LIPID PANEL
Cholesterol: 172 mg/dL (ref <200)
HDL: 38 mg/dL — ABNORMAL LOW (ref >=40)
LDL Cholesterol (Calc): 104 mg/dL (calc) — ABNORMAL HIGH
Non-HDL Cholesterol (Calc): 134 mg/dL (calc) — ABNORMAL HIGH (ref <130)
Total CHOL/HDL Ratio: 4.5 (calc) (ref <5.0)
Triglycerides: 179 mg/dL — ABNORMAL HIGH (ref <150)

## 2022-03-18 LAB — HIV-1 RNA QUANT-NO REFLEX-BLD
HIV 1 RNA Quant: <20 Copies/mL — ABNORMAL HIGH
HIV-1 RNA Quant, Log: <1.3 Log cps/mL — ABNORMAL HIGH

## 2022-03-19 ENCOUNTER — Telehealth: Payer: Self-pay

## 2022-03-19 NOTE — Telephone Encounter (Signed)
-----   Message from Veryl Speak, FNP sent at 03/19/2022  4:22 PM EST ----- Please inform Ethan Farrell that his lab work shows that he is undetectable and CD4 count is 725. Kidney function, liver function and electrolytes are within normal ranges. Cholesterol with HDL 38 which is a good improvement and LDL down to 104 with triglycerides being a little high at 179 (goal <150). He is already on Crestor for this.

## 2022-03-19 NOTE — Telephone Encounter (Signed)
Spoke with Windy Fast, relayed that viral load undetectable and CD4 count healthy at over 700. Relayed that kidney/liver function and electrolytes are normal.   Discussed improvement of HDL to 38 and LDL to 104, but that triglycerides are still slightly elevated at 179 and that he is on Crestor for this. Patient verbalized understanding and has no further questions.   Sandie Ano, RN

## 2022-04-06 DIAGNOSIS — N5089 Other specified disorders of the male genital organs: Secondary | ICD-10-CM | POA: Insufficient documentation

## 2022-04-28 DIAGNOSIS — K611 Rectal abscess: Secondary | ICD-10-CM | POA: Insufficient documentation

## 2022-09-14 ENCOUNTER — Other Ambulatory Visit: Payer: Self-pay

## 2022-09-14 ENCOUNTER — Encounter: Payer: Self-pay | Admitting: Family

## 2022-09-14 ENCOUNTER — Ambulatory Visit: Payer: Commercial Managed Care - PPO | Admitting: Family

## 2022-09-14 VITALS — BP 147/79 | HR 66 | Resp 16 | Ht 73.0 in | Wt 222.0 lb

## 2022-09-14 DIAGNOSIS — Z0000 Encounter for general adult medical examination without abnormal findings: Secondary | ICD-10-CM

## 2022-09-14 DIAGNOSIS — Z23 Encounter for immunization: Secondary | ICD-10-CM

## 2022-09-14 DIAGNOSIS — B20 Human immunodeficiency virus [HIV] disease: Secondary | ICD-10-CM | POA: Diagnosis not present

## 2022-09-14 MED ORDER — BIKTARVY 50-200-25 MG PO TABS: 1.0000 | ORAL_TABLET | Freq: Every day | ORAL | 6 refills | Status: DC

## 2022-09-14 MED ORDER — ZOSTER VAC RECOMB ADJUVANTED 50 MCG/0.5ML IM SUSR: 0.5000 mL | Freq: Once | INTRAMUSCULAR | 1 refills | Status: AC

## 2022-09-14 NOTE — Patient Instructions (Addendum)
Nice to see you.  We will check your lab work today.  Continue to take your medication daily as prescribed.  Schedule your Shingrix.  Refills have been sent to the pharmacy.  Plan for follow up in 6 months or sooner if needed with lab work on the same day.  Have a great day and stay safe!

## 2022-09-14 NOTE — Addendum Note (Signed)
Addended by: Clayborne Artist A on: 09/14/2022 04:46 PM   Modules accepted: Orders

## 2022-09-14 NOTE — Progress Notes (Signed)
Brief Narrative   Patient ID: Ethan Farrell, male    DOB: 1960-09-25, 62 y.o.   MRN: [id number]  Ethan Farrell is a 62 y/o caucasian male with HIV disease diagnosed in June 2020 with risk factor of MSM.  Initial CD4 count and viral load unavailable. Clinic entry with viral load of 239 and CD4 count of 568. No genotype performed. No history of opportunistic infection. No history of opportunistic infection. [id number] negative. Sole medication regimen of Biktarvy.    Subjective:    Chief Complaint  Patient presents with   Follow-up   HIV Positive/AIDS    HPI:  Ethan Farrell is a 62 y.o. male with HIV disease last seen on 03/16/22 with well controlled virus and good adherence and tolerance to Biktarvy. Viral load was undetectable and CD4 count 725. Kidney fucntion, liver function and electrolytes within normal ranges. Here today for routine follow up.  Ethan Farrell has been doing well since his last office visit and continues to take [facility] as prescribed with no adverse side effects or problems obtaining medication. No new concerns/complaints. Condoms and STD testing offered. Due for routine dental care, Shingrix and tetanus. Routine colon cancer screening up to date.   Denies fevers, chills, night sweats, headaches, changes in vision, neck pain/stiffness, nausea, diarrhea, vomiting, lesions or rashes.   Allergies  Allergen Reactions   Prednisone     Itching, flushing   Sulfa Antibiotics     Childhood > rash      Outpatient Medications Prior to Visit  Medication Sig Dispense Refill   aspirin 81 MG chewable tablet Chew by mouth.     gabapentin (NEURONTIN) 300 MG capsule Take 300 mg by mouth 3 (three) times daily.     levothyroxine (SYNTHROID) 125 MCG tablet Take 125 mcg by mouth every morning.     lisinopril (ZESTRIL) 20 MG tablet Take 1 tablet by mouth daily. 90 tablet 3   metFORMIN (GLUCOPHAGE) 1000 MG tablet Take 1,000 mg by mouth daily.     montelukast (SINGULAIR) 10 MG  tablet Take 10 mg by mouth at bedtime.     MOUNJARO 10 MG/0.5ML Pen Inject into the skin once a week.     nitroGLYCERIN (NITROSTAT) 0.4 MG SL tablet Place 0.4 mg under the tongue every 5 (five) minutes as needed.     rosuvastatin (CRESTOR) 40 MG tablet Take 40 mg by mouth daily.     bictegravir-emtricitabine-tenofovir AF (BIKTARVY) 50-200-25 MG TABS tablet Take 1 tablet by mouth daily. 30 tablet 6   amoxicillin-clavulanate (AUGMENTIN) 875-125 MG tablet Take 1 tablet by mouth 2 (two) times daily. (Patient not taking: Reported on 09/14/2022)     No facility-administered medications prior to visit.     Past Medical History:  Diagnosis Date   Asthma    DM type 2 (diabetes mellitus, type 2) (HCC)    Heart attack (HCC) 11/2009   2 stents   HIV infection (HCC)    HTN (hypertension)    Hypercholesterolemia    Hypothyroid    OSA (obstructive sleep apnea)    Seasonal allergies      Past Surgical History:  Procedure Laterality Date   APPENDECTOMY     BACK SURGERY     CARPAL TUNNEL RELEASE Left    KNEE SURGERY     left   right shoulder     right shoulder spurs   ROTATOR CUFF REPAIR     right   UMBILICAL HERNIA REPAIR  Review of Systems  Constitutional:  Negative for appetite change, chills, fatigue, fever and unexpected weight change.  Eyes:  Negative for visual disturbance.  Respiratory:  Negative for cough, chest tightness, shortness of breath and wheezing.   Cardiovascular:  Negative for chest pain and leg swelling.  Gastrointestinal:  Negative for abdominal pain, constipation, diarrhea, nausea and vomiting.  Genitourinary:  Negative for dysuria, flank pain, frequency, genital sores, hematuria and urgency.  Skin:  Negative for rash.  Allergic/Immunologic: Negative for immunocompromised state.  Neurological:  Negative for dizziness and headaches.      Objective:    BP (!) 147/79   Pulse 66   Resp 16   Ht 6\' 1"  (1.854 m)   Wt 222 lb (100.7 kg)   SpO2 98%   BMI  29.29 kg/m  Nursing note and vital signs reviewed.  Physical Exam Constitutional:      General: He is not in acute distress.    Appearance: He is well-developed.  Eyes:     Conjunctiva/sclera: Conjunctivae normal.  Cardiovascular:     Rate and Rhythm: Normal rate and regular rhythm.     Heart sounds: Normal heart sounds. No murmur heard.    No friction rub. No gallop.  Pulmonary:     Effort: Pulmonary effort is normal. No respiratory distress.     Breath sounds: Normal breath sounds. No wheezing or rales.  Chest:     Chest wall: No tenderness.  Abdominal:     General: Bowel sounds are normal.     Palpations: Abdomen is soft.     Tenderness: There is no abdominal tenderness.  Musculoskeletal:     Cervical back: Neck supple.  Lymphadenopathy:     Cervical: No cervical adenopathy.  Skin:    General: Skin is warm and dry.     Findings: No rash.  Neurological:     Mental Status: He is alert and oriented to person, place, and time.  Psychiatric:        Behavior: Behavior normal.        Thought Content: Thought content normal.        Judgment: Judgment normal.         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   Right inguinal hernia 11/18/2021   Lower abdominal pain 07/14/2021   Syphilis 02/29/2020   Diabetic peripheral neuropathy (HCC) 11/10/2019   Coronary artery disease 11/10/2019   Ulnar neuropathy of both upper extremities 11/10/2019   Type 2 diabetes mellitus without complication (HCC) 11/10/2019   HSV infection 08/21/2019   Healthcare maintenance  11/08/2018   HIV disease (HCC) 10/04/2018   Type 2 diabetes mellitus without complications (HCC) 10/04/2018   Hypothyroidism 10/04/2018   Hypertension 10/04/2018   Bilateral pulmonary infiltrates on chest x-ray 07/24/2010   Pneumonitis, hypersensitivity (HCC) 07/24/2010   OSA on CPAP 07/24/2010     Problem List Items Addressed This Visit  Other   HIV disease (HCC) - Primary    Ethan Farrell continues to have well-controlled virus with good adherence and tolerance to Ethan Farrell.  Reviewed previous lab work and discussed plan of care and U equals U.  Check blood work.  Continue current dose of Biktarvy.  Plan for follow-up in 6 months or sooner if needed with lab work on the same day.      Relevant Medications   bictegravir-emtricitabine-tenofovir AF (BIKTARVY) 50-200-25 MG TABS tablet   Zoster Vaccine Adjuvanted [facility]) injection   Other Relevant Orders   COMPLETE METABOLIC PANEL WITH GFR   HIV-1 RNA quant-no reflex-bld   T-helper cells (CD4) count (not at [facility])   Healthcare maintenance    Discussed importance of safe sexual practice and condom use. Condoms and STD testing offered.  Tetanus updated; prescription sent for Shingrix.  Colonoscopy due next year. Will schedule dental care independently and can refer to [facility] if needed.         I am having De Nurse. Vanmeter start on Zoster Vaccine Adjuvanted. I am also having him maintain his aspirin, levothyroxine, metFORMIN, Mounjaro, gabapentin, lisinopril, montelukast, nitroGLYCERIN, rosuvastatin, amoxicillin-clavulanate, and Biktarvy.   Meds ordered this encounter  Medications   bictegravir-emtricitabine-tenofovir AF (BIKTARVY) 50-200-25 MG TABS tablet    Sig: Take 1 tablet by mouth daily.    Dispense:  30 tablet    Refill:  6    Order Specific Question:   Supervising Provider    Answer:   Drue Second, CYNTHIA [[id number]]   Zoster Vaccine Adjuvanted [facility]) injection    Sig: Inject 0.5 mLs into the muscle once for 1 dose.  Repeat in 2-6 months.    Dispense:  0.5 mL    Refill:  1    Order Specific Question:   Supervising Provider    Answer:   Judyann Munson [[id number]]     Follow-up: Return in about 6 months (around 03/16/2023), or if symptoms worsen or fail to improve.   Marcos Eke, MSN, FNP-C Nurse Practitioner [facility] for Infectious Disease [facility] Main number: [phone]

## 2022-09-14 NOTE — Assessment & Plan Note (Signed)
Mr. Gardipee continues to have well-controlled virus with good adherence and tolerance to [employer].  Reviewed previous lab work and discussed plan of care and U equals U.  Check blood work.  Continue current dose of Biktarvy.  Plan for follow-up in 6 months or sooner if needed with lab work on the same day.

## 2022-09-14 NOTE — Assessment & Plan Note (Signed)
Discussed importance of safe sexual practice and condom use. Condoms and STD testing offered.  Tetanus updated; prescription sent for Shingrix.  Colonoscopy due next year. Will schedule dental care independently and can refer to [facility] if needed.

## 2022-09-16 LAB — COMPLETE METABOLIC PANEL WITH GFR
AG Ratio: 1.6 (calc) (ref 1.0–2.5)
ALT: 10 U/L (ref 9–46)
AST: 13 U/L (ref 10–35)
Albumin: 4.1 g/dL (ref 3.6–5.1)
Alkaline phosphatase (APISO): 77 U/L (ref 35–144)
BUN: 13 mg/dL (ref 7–25)
CO2: 29 mmol/L (ref 20–32)
Calcium: 9.4 mg/dL (ref 8.6–10.3)
Chloride: 107 mmol/L (ref 98–110)
Creat: 0.89 mg/dL (ref 0.70–1.35)
Globulin: 2.5 g/dL (calc) (ref 1.9–3.7)
Glucose, Bld: 86 mg/dL (ref 65–99)
Potassium: 4 mmol/L (ref 3.5–5.3)
Sodium: 142 mmol/L (ref 135–146)
Total Bilirubin: 0.5 mg/dL (ref 0.2–1.2)
Total Protein: 6.6 g/dL (ref 6.1–8.1)
eGFR: 96 mL/min/{1.73_m2} (ref >=60)

## 2022-09-16 LAB — T-HELPER CELLS (CD4) COUNT (NOT AT ARMC)
Absolute CD4: 761 cells/uL (ref 490–1740)
CD4 T Helper %: 38 % (ref 30–61)
Total lymphocyte count: 2019 cells/uL (ref 850–3900)

## 2022-09-16 LAB — HIV-1 RNA QUANT-NO REFLEX-BLD
HIV 1 RNA Quant: NOT DETECTED Copies/mL
HIV-1 RNA Quant, Log: NOT DETECTED Log cps/mL

## 2022-09-17 ENCOUNTER — Telehealth: Payer: Self-pay

## 2022-09-17 NOTE — Telephone Encounter (Signed)
Patient informed of lab results and verbalized understanding.  Cristan Scherzer T Kehlani Vancamp  

## 2022-09-17 NOTE — Telephone Encounter (Signed)
-----   Message from Veryl Speak, FNP sent at 09/17/2022  4:23 PM EDT ----- Please inform Ethan Farrell that his lab work looks good - viral load is undetectable and CD4 count is 761. Kidney function, liver function and electrolytes are within normal ranges. Thanks!

## 2023-02-15 ENCOUNTER — Other Ambulatory Visit: Payer: Self-pay | Admitting: Cardiology

## 2023-02-15 DIAGNOSIS — I10 Essential (primary) hypertension: Secondary | ICD-10-CM

## 2023-03-11 ENCOUNTER — Other Ambulatory Visit: Payer: Self-pay | Admitting: Cardiology

## 2023-03-11 DIAGNOSIS — I10 Essential (primary) hypertension: Secondary | ICD-10-CM

## 2023-03-29 ENCOUNTER — Ambulatory Visit: Payer: Commercial Managed Care - PPO | Admitting: Family

## 2023-04-12 ENCOUNTER — Other Ambulatory Visit: Payer: Self-pay | Admitting: Cardiology

## 2023-04-12 DIAGNOSIS — I10 Essential (primary) hypertension: Secondary | ICD-10-CM

## 2023-04-27 ENCOUNTER — Ambulatory Visit: Payer: Commercial Managed Care - PPO | Admitting: Family

## 2023-04-27 ENCOUNTER — Telehealth: Payer: Self-pay

## 2023-04-27 NOTE — Telephone Encounter (Signed)
Attempted to call patient regarding missed appt. Left vm requesting call back. Juanita Laster, RMA

## 2023-05-11 ENCOUNTER — Other Ambulatory Visit: Payer: Self-pay

## 2023-05-11 ENCOUNTER — Other Ambulatory Visit: Payer: Self-pay | Admitting: Cardiology

## 2023-05-11 ENCOUNTER — Other Ambulatory Visit: Payer: Self-pay | Admitting: Family

## 2023-05-11 DIAGNOSIS — B20 Human immunodeficiency virus [HIV] disease: Secondary | ICD-10-CM

## 2023-05-11 DIAGNOSIS — I10 Essential (primary) hypertension: Secondary | ICD-10-CM

## 2023-05-11 MED ORDER — LISINOPRIL 20 MG PO TABS: 20.0000 mg | ORAL_TABLET | Freq: Every day | ORAL | 0 refills | Status: DC

## 2023-05-20 ENCOUNTER — Other Ambulatory Visit: Payer: Self-pay

## 2023-05-20 ENCOUNTER — Encounter: Payer: Self-pay | Admitting: Family

## 2023-05-20 ENCOUNTER — Other Ambulatory Visit (HOSPITAL_COMMUNITY)
Admission: RE | Admit: 2023-05-20 | Discharge: 2023-05-20 | Disposition: A | Discharge status: Home / Self Care | Source: Ambulatory Visit | Attending: Family | Admitting: Family

## 2023-05-20 ENCOUNTER — Ambulatory Visit: Payer: Commercial Managed Care - PPO | Admitting: Family

## 2023-05-20 VITALS — BP 162/77 | HR 81 | Temp 98.4°F | Resp 16 | Wt 231.0 lb

## 2023-05-20 DIAGNOSIS — I10 Essential (primary) hypertension: Secondary | ICD-10-CM

## 2023-05-20 DIAGNOSIS — B20 Human immunodeficiency virus [HIV] disease: Secondary | ICD-10-CM | POA: Diagnosis not present

## 2023-05-20 DIAGNOSIS — Z0000 Encounter for general adult medical examination without abnormal findings: Secondary | ICD-10-CM

## 2023-05-20 DIAGNOSIS — F17210 Nicotine dependence, cigarettes, uncomplicated: Secondary | ICD-10-CM | POA: Insufficient documentation

## 2023-05-20 DIAGNOSIS — Z1212 Encounter for screening for malignant neoplasm of rectum: Secondary | ICD-10-CM | POA: Diagnosis present

## 2023-05-20 MED ORDER — BIKTARVY 50-200-25 MG PO TABS: 1.0000 | ORAL_TABLET | Freq: Every day | ORAL | 6 refills | Status: DC

## 2023-05-20 NOTE — Assessment & Plan Note (Signed)
Ethan Farrell continues to have well-controlled virus with good adherence and tolerance to [employer].  Reviewed previous lab work and discussed plan of care and U equals U.  Insurance coverage through [employer].  Social determinants of health reviewed with no interventions necessary.  Continue current dose of Biktarvy.  Check blood work.  Plan for follow-up in 6 months or sooner if needed with lab work on the same day.

## 2023-05-20 NOTE — Assessment & Plan Note (Signed)
Mr. Stimpson has approximately 25 year pack history of tobacco use and discussed importance of tobacco cessation to reduce risk of disease development or complications in the future. Methods of tobacco cessation reviewed. Qualifies for lung cancer screening through low dose CT scan and have placed order.

## 2023-05-20 NOTE — Patient Instructions (Addendum)
Nice to see you.  We will check your lab work today.  Continue to take your medication daily as prescribed.  Refills have been sent to the pharmacy.  Plan for follow up in 6 months or sooner if needed with lab work on the same day.  Have a great day and stay safe!   Please call Dr. Jens Som [phone]

## 2023-05-20 NOTE — Progress Notes (Signed)
**Note Ethan-Identified via Obfuscation** Brief Narrative   Patient ID: Ethan Farrell, male    DOB: 1961/03/26, 63 y.o.   MRN: [id number]  Ethan Farrell is a 63 y/o caucasian male with HIV disease diagnosed in June 2020 with risk factor of MSM.  Initial CD4 count and viral load unavailable. Clinic entry with viral load of 239 and CD4 count of 568. No genotype performed. No history of opportunistic infection. No history of opportunistic infection. [id number] negative. Sole medication regimen of Biktarvy.    Subjective:    Chief Complaint  Patient presents with   Follow-up    B20     HPI:  Ethan Farrell is a 63 y.o. male with HIV disease last seen on 09/14/2022 with well-controlled virus and good adherence and tolerance to [employer].  Viral load was undetectable with CD4 count 725.  Renal function, hepatic function, and electrolytes within normal ranges.  Here today for routine follow-up.  Ethan Farrell has been doing well since his last office visit and continues to take [facility] as prescribed with no adverse side effects or problems obtaining medication from the pharmacy.  Covered by [employer].  Had a fall recently while remodeling his grandchild's nursery fracturing a rib.  This is slowly improving.  Housing and access to food are stable.  Transportation is via vehicle.  No new concerns/complaints.  Continues to smoke tobacco with approximately 25-pack-year history.  Healthcare maintenance reviewed.  Condoms and site-specific STD testing offered.  Denies fevers, chills, night sweats, headaches, changes in vision, neck pain/stiffness, nausea, diarrhea, vomiting, lesions or rashes.  Lab Results  Component Value Date   CD4TCELL 38 09/14/2022   CD4TABS 725 03/16/2022   Lab Results  Component Value Date   HIV1RNAQUANT Not Detected 09/14/2022     Allergies  Allergen Reactions   Prednisone     Itching, flushing   Sulfa Antibiotics     Childhood > rash      Outpatient Medications Prior to Visit  Medication Sig  Dispense Refill   aspirin 81 MG chewable tablet Chew by mouth.     gabapentin (NEURONTIN) 300 MG capsule Take 300 mg by mouth 3 (three) times daily.     ibuprofen (ADVIL) 800 MG tablet Take 800 mg by mouth every 6 (six) hours as needed.     levothyroxine (SYNTHROID) 125 MCG tablet Take 125 mcg by mouth every morning.     lisinopril (ZESTRIL) 20 MG tablet Take 1 tablet (20 mg total) by mouth daily. 15 tablet 0   meloxicam (MOBIC) 7.5 MG tablet Take 15 mg by mouth daily.     metFORMIN (GLUCOPHAGE) 1000 MG tablet Take 1,000 mg by mouth daily.     montelukast (SINGULAIR) 10 MG tablet Take 10 mg by mouth at bedtime.     MOUNJARO 10 MG/0.5ML Pen Inject into the skin once a week.     nitroGLYCERIN (NITROSTAT) 0.4 MG SL tablet Place 0.4 mg under the tongue every 5 (five) minutes as needed.     rosuvastatin (CRESTOR) 40 MG tablet Take 40 mg by mouth daily.     BIKTARVY 50-200-25 MG TABS tablet TAKE 1 TABLET BY MOUTH DAILY 30 tablet 0   amoxicillin-clavulanate (AUGMENTIN) 875-125 MG tablet Take 1 tablet by mouth 2 (two) times daily. (Patient not taking: Reported on 05/20/2023)     No facility-administered medications prior to visit.     Past Medical History:  Diagnosis Date   Asthma    DM type 2 (diabetes mellitus, type 2) (HCC)  Heart attack (HCC) 11/2009   2 stents   HIV infection (HCC)    HTN (hypertension)    Hypercholesterolemia    Hypothyroid    OSA (obstructive sleep apnea)    Seasonal allergies      Past Surgical History:  Procedure Laterality Date   APPENDECTOMY     BACK SURGERY     CARPAL TUNNEL RELEASE Left    KNEE SURGERY     left   right shoulder     right shoulder spurs   ROTATOR CUFF REPAIR     right   UMBILICAL HERNIA REPAIR        Review of Systems  Constitutional:  Negative for appetite change, chills, fatigue, fever and unexpected weight change.  Eyes:  Negative for visual disturbance.  Respiratory:  Negative for cough, chest tightness, shortness of  breath and wheezing.   Cardiovascular:  Negative for chest pain and leg swelling.  Gastrointestinal:  Negative for abdominal pain, constipation, diarrhea, nausea and vomiting.  Genitourinary:  Negative for dysuria, flank pain, frequency, genital sores, hematuria and urgency.  Skin:  Negative for rash.  Allergic/Immunologic: Negative for immunocompromised state.  Neurological:  Negative for dizziness and headaches.      Objective:    BP (!) 162/77   Pulse 81   Temp 98.4 F (36.9 C) (Oral)   Resp 16   Wt 231 lb (104.8 kg)   SpO2 96%   BMI 30.48 kg/m  Nursing note and vital signs reviewed.  Physical Exam Constitutional:      General: He is not in acute distress.    Appearance: He is well-developed.  Eyes:     Conjunctiva/sclera: Conjunctivae normal.  Cardiovascular:     Rate and Rhythm: Normal rate and regular rhythm.     Heart sounds: Normal heart sounds. No murmur heard.    No friction rub. No gallop.  Pulmonary:     Effort: Pulmonary effort is normal. No respiratory distress.     Breath sounds: Normal breath sounds. No wheezing or rales.  Chest:     Chest wall: No tenderness.  Abdominal:     General: Bowel sounds are normal.     Palpations: Abdomen is soft.     Tenderness: There is no abdominal tenderness.  Musculoskeletal:     Cervical back: Neck supple.  Lymphadenopathy:     Cervical: No cervical adenopathy.  Skin:    General: Skin is warm and dry.     Findings: No rash.  Neurological:     Mental Status: He is alert and oriented to person, place, and time.  Psychiatric:        Behavior: Behavior normal.        Thought Content: Thought content normal.        Judgment: Judgment normal.         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  Right inguinal hernia 11/18/2021   Lower abdominal pain 07/14/2021   Syphilis 02/29/2020   Diabetic peripheral neuropathy (HCC) 11/10/2019   Coronary artery disease 11/10/2019   Ulnar neuropathy of both upper extremities 11/10/2019   Type 2 diabetes mellitus without complication (HCC) 11/10/2019   HSV infection 08/21/2019   Healthcare maintenance 11/08/2018   HIV disease (HCC) 10/04/2018   Type 2 diabetes mellitus without complications (HCC) 10/04/2018   Hypothyroidism 10/04/2018   Hypertension 10/04/2018   Bilateral pulmonary infiltrates on chest x-ray 07/24/2010   Pneumonitis, hypersensitivity (HCC) 07/24/2010   OSA on CPAP 07/24/2010     Problem List Items Addressed This Visit       Cardiovascular and Mediastinum   Hypertension   Blood pressure elevated above goal 130/80 with current dose of lisinopril.  No neurologic or ophthalmologic signs/symptoms.  Encouraged to monitor blood pressure at home.  Continue current dose of lisinopril with additional medication per internal medicine.        Other   HIV disease (HCC) - Primary   Ethan Farrell continues to have well-controlled virus with good adherence and tolerance to Ethan Farrell.  Reviewed previous lab work and discussed plan of care and U equals U.  Insurance coverage through [employer].  Social determinants of health reviewed with no interventions necessary.  Continue current dose of Biktarvy.  Check blood work.  Plan for follow-up in 6 months or sooner if needed with lab work on the same day.      Relevant Medications   bictegravir-emtricitabine-tenofovir AF (BIKTARVY) 50-200-25 MG TABS tablet   Other Relevant Orders   COMPLETE METABOLIC PANEL WITH GFR   HIV-1 RNA quant-no reflex-bld    T-helper cell (CD4)- ([facility] clinic only)   Healthcare maintenance   Discussed importance of safe sexual practice and condom use. Condoms and site specific STD testing offered.  Vaccinations reviewed - recommend Shingrix with PPSV23 after 65.  Dental care up to date.  Colon cancer screening up to date.  Anal pap smear collected.       Cigarette smoker   Mr. Chalmers has approximately 25 year pack history of tobacco use and discussed importance of tobacco cessation to reduce risk of disease development or complications in the future. Methods of tobacco cessation reviewed. Qualifies for lung cancer screening through low dose CT scan and have placed order.       Relevant Orders   CT CHEST LUNG CA SCREEN LOW DOSE W/O CM   Other Visit Diagnoses       Screening for rectal cancer       Relevant Orders   Cytology - PAP        I have discontinued Ethan Farrell's amoxicillin-clavulanate. I have also changed his Biktarvy. Additionally, I am having him maintain his aspirin, levothyroxine, metFORMIN, Mounjaro, gabapentin, montelukast, nitroGLYCERIN, rosuvastatin, lisinopril, meloxicam, and ibuprofen.   Meds ordered this encounter  Medications   bictegravir-emtricitabine-tenofovir AF (BIKTARVY) 50-200-25 MG TABS tablet    Sig: Take 1 tablet by mouth daily.    Dispense:  30 tablet    Refill:  6    Supervising Provider:   Judyann Munson [[id number]]     Follow-up: Return in about 6 months (around 11/17/2023). or sooner if needed.    Marcos Eke, MSN, FNP-C Nurse Practitioner [facility] Main number: [phone]

## 2023-05-20 NOTE — Assessment & Plan Note (Signed)
Blood pressure elevated above goal 130/80 with current dose of lisinopril.  No neurologic or ophthalmologic signs/symptoms.  Encouraged to monitor blood pressure at home.  Continue current dose of lisinopril with additional medication per internal medicine.

## 2023-05-20 NOTE — Assessment & Plan Note (Addendum)
Discussed importance of safe sexual practice and condom use. Condoms and site specific STD testing offered.  Vaccinations reviewed - recommend Shingrix with PPSV23 after 65.  Dental care up to date.  Colon cancer screening up to date.  Anal pap smear collected.

## 2023-05-21 LAB — T-HELPER CELL (CD4) - (RCID CLINIC ONLY)
CD4 % Helper T Cell: 38 % (ref 33–65)
CD4 T Cell Abs: 670 /uL (ref 400–1790)

## 2023-05-23 LAB — COMPLETE METABOLIC PANEL WITH GFR
AG Ratio: 1.8 (calc) (ref 1.0–2.5)
ALT: 12 U/L (ref 9–46)
AST: 15 U/L (ref 10–35)
Albumin: 4.2 g/dL (ref 3.6–5.1)
Alkaline phosphatase (APISO): 80 U/L (ref 35–144)
BUN: 14 mg/dL (ref 7–25)
CO2: 27 mmol/L (ref 20–32)
Calcium: 9.2 mg/dL (ref 8.6–10.3)
Chloride: 106 mmol/L (ref 98–110)
Creat: 0.92 mg/dL (ref 0.70–1.35)
Globulin: 2.4 g/dL (ref 1.9–3.7)
Glucose, Bld: 87 mg/dL (ref 65–99)
Potassium: 3.9 mmol/L (ref 3.5–5.3)
Sodium: 141 mmol/L (ref 135–146)
Total Bilirubin: 0.4 mg/dL (ref 0.2–1.2)
Total Protein: 6.6 g/dL (ref 6.1–8.1)
eGFR: 93 mL/min/{1.73_m2} (ref >=60)

## 2023-05-23 LAB — HIV-1 RNA QUANT-NO REFLEX-BLD
HIV 1 RNA Quant: NOT DETECTED {copies}/mL
HIV-1 RNA Quant, Log: NOT DETECTED {Log}

## 2023-05-25 ENCOUNTER — Inpatient Hospital Stay (HOSPITAL_BASED_OUTPATIENT_CLINIC_OR_DEPARTMENT_OTHER)
Admission: RE | Admit: 2023-05-25 | Payer: Commercial Managed Care - PPO | Source: Ambulatory Visit | Admitting: Radiology

## 2023-05-28 LAB — CYTOLOGY - PAP
Comment: NEGATIVE
Comment: NEGATIVE
Comment: NEGATIVE
HPV 16: NEGATIVE
HPV 18 / 45: POSITIVE — AB
High risk HPV: POSITIVE — AB

## 2023-06-01 ENCOUNTER — Ambulatory Visit (INDEPENDENT_AMBULATORY_CARE_PROVIDER_SITE_OTHER)
Admission: RE | Admit: 2023-06-01 | Discharge: 2023-06-01 | Disposition: A | Discharge status: Home / Self Care | Source: Ambulatory Visit | Attending: Family | Admitting: Family

## 2023-06-01 ENCOUNTER — Telehealth: Payer: Self-pay

## 2023-06-01 DIAGNOSIS — F17210 Nicotine dependence, cigarettes, uncomplicated: Secondary | ICD-10-CM | POA: Diagnosis not present

## 2023-06-01 DIAGNOSIS — Z1212 Encounter for screening for malignant neoplasm of rectum: Secondary | ICD-10-CM

## 2023-06-01 NOTE — Telephone Encounter (Signed)
 Spoke with patient regarding anal pap results. Understands that since results did come back abnormal we will refer him to [facility] for additional testing. No questions at this time. Juanita Laster, RMA

## 2023-06-01 NOTE — Addendum Note (Signed)
 Addended by: Juanita Laster on: 06/01/2023 01:12 PM   Modules accepted: Orders

## 2023-06-01 NOTE — Telephone Encounter (Signed)
 Patient called office to follow up on results. Reviewed patient's VL,Cd4 and CMP.  Per Carver Fila, FNP will need to be referred to [facility] or [facility] due to anal pap results for further testing.  Juanita Laster, RMA

## 2023-06-14 ENCOUNTER — Telehealth: Payer: Self-pay

## 2023-06-14 NOTE — Telephone Encounter (Signed)
 Patient would like to go over CT results. Call back [phone].

## 2023-06-15 ENCOUNTER — Telehealth: Payer: Self-pay | Admitting: Cardiology

## 2023-06-15 NOTE — Telephone Encounter (Signed)
 STAT if patient feels like he/she is going to faint   Are you dizzy, lightheaded, or faint now?  No, but patient has had dizziness on and off over the past week.  Have you passed out?  No  Do you have any other symptoms?  Neuropathy in hands over the past month, CT showed pulmonary issues. PCP advised to follow up with cardiology.  Have you checked your HR and BP (record if available)?  BP 142-150/72-78  HR running in mid 70's

## 2023-06-15 NOTE — Telephone Encounter (Signed)
 Attempted to contact Ethan Farrell reaching his voicemail. HIPAA compliant message left and will return call.   Marcos Eke, NP 06/15/2023 9:32 AM

## 2023-06-15 NOTE — Telephone Encounter (Signed)
 LVM to CB. 06/15/2023 at 1720

## 2023-06-16 NOTE — Telephone Encounter (Signed)
 Patient identification verified by 2 forms. [name], RN    Called and spoke to patient  Patient states:  -has been having dizziness, numbness/tingling in hands and tips of fingers   -numbness and tingling occurs in both hands/fingers, started 3 weeks   -he has not followed up with primary   -dizziness does not occur daily, happens multiple times a week   -dizziness occurs at random   -fell 2 weeks ago due to dizziness after stepping on a step stool   -recent lung CT showed concern for pulmonary artery disease    -he was advised to follow up with cardiology regarding CT results   -at dental office BP was 168/86  -Typically BP is in the 140s/70s   -Takes Lisinopril 20mg  daily  Patient denies:   -dizziness at time of call   -chest pain  -head strike with fall  Patient scheduled for OV 4/2 at 3:10pm with NP Cleaver  Advised patient to keep BP log and review at OV  Advised patient to follow up with PCP regarding numbness/tingling  Reviewed ED warning signs/precautions  Patient verbalized understanding, no questions at this time

## 2023-06-25 NOTE — Progress Notes (Unsigned)
 Cardiology Clinic Note   Patient Name: Ethan Farrell Date of Encounter: 06/30/2023  Primary Care Provider:  System, Provider Not In Primary Cardiologist:  Olga Millers, MD  Patient Profile    Ethan Farrell presents to the clinic today for evaluation of his dizziness.  Past Medical History    Past Medical History:  Diagnosis Date   Asthma    DM type 2 (diabetes mellitus, type 2) (HCC)    Heart attack (HCC) 11/2009   2 stents   HIV infection (HCC)    HTN (hypertension)    Hypercholesterolemia    Hypothyroid    OSA (obstructive sleep apnea)    Seasonal allergies    Past Surgical History:  Procedure Laterality Date   APPENDECTOMY     BACK SURGERY     CARPAL TUNNEL RELEASE Left    KNEE SURGERY     left   right shoulder     right shoulder spurs   ROTATOR CUFF REPAIR     right   UMBILICAL HERNIA REPAIR      Allergies  Allergies  Allergen Reactions   Prednisone     Itching, flushing   Sulfa Antibiotics     Childhood > rash    History of Present Illness    Ethan Farrell has a PMH of hypertension, coronary artery disease, OSA on CPAP, type 2 diabetes, hypothyroidism, HIV, and tobacco use.  He was seen and evaluated by Dr. Jens Som on 12/02/2021 for preoperative cardiac evaluation prior to inguinal hernia repair.  He underwent PCI of his RCA in 2011.  Cardiac catheterization was also performed 8/18 which showed severe stenosis of his proximal right RCA 80% and patent stents in the mid right RCA with other wise nonocclusive coronary disease.  His EF was noted to be 55%.  He received PCI with DES to his RCA.  On review he reported that he can walk 3 miles with no dyspnea.  He denied orthopnea and PND.  He denied lower extremity edema, exertional chest pain and syncope.  He contacted nurse triage line on 06/16/2023.  He reported having dizziness and numbness/tingling in his hands and fingertips.  The numbness and tingling was in bilateral hands and had been present  for 3 weeks.  He followed up with his PCP.  He reported that his dizziness did not occur daily but would happen multiple times per week.  He presents to the clinic today for evaluation and states he has been noticing intermittent episodes of dizziness for the last several weeks.  He notices these mainly in the morning.  He reports that he does not stay very well-hydrated.  He drinks very little water and drinks coffee mainly throughout the day.  He has been noticing elevated blood pressures at home in the 150 systolic range.  Today in the office his blood pressure is 120/60.  We reviewed his history of coronary artery disease.  I will order nuclear stress testing, echocardiogram, CBC, BMP, have him increase his p.o. hydration and plan follow-up in 2 months..  Today he denies chest pain, shortness of breath, lower extremity edema, fatigue, palpitations, melena, hematuria, and hemoptysis.   Home Medications    Prior to Admission medications   Medication Sig Start Date End Date Taking? Authorizing Provider  aspirin 81 MG chewable tablet Chew by mouth. 11/20/16   [provider]  bictegravir-emtricitabine-tenofovir AF (BIKTARVY) 50-200-25 MG TABS tablet Take 1 tablet by mouth daily. 05/20/23   Veryl Speak, FNP  gabapentin (NEURONTIN) 300 MG capsule Take 300 mg by mouth 3 (three) times daily. 10/15/20   [provider]  ibuprofen (ADVIL) 800 MG tablet Take 800 mg by mouth every 6 (six) hours as needed. 03/11/23   [provider]  levothyroxine (SYNTHROID) 125 MCG tablet Take 125 mcg by mouth every morning. 07/24/19   [provider]  lisinopril (ZESTRIL) 20 MG tablet Take 1 tablet (20 mg total) by mouth daily. 05/11/23   Lewayne Bunting, MD  meloxicam (MOBIC) 7.5 MG tablet Take 15 mg by mouth daily. 05/13/23   [provider]  metFORMIN (GLUCOPHAGE) 1000 MG tablet Take 1,000 mg by mouth daily. 07/24/19   [provider]  montelukast (SINGULAIR) 10  MG tablet Take 10 mg by mouth at bedtime.    [provider]  MOUNJARO 10 MG/0.5ML Pen Inject into the skin once a week. 12/04/20   [provider]  nitroGLYCERIN (NITROSTAT) 0.4 MG SL tablet Place 0.4 mg under the tongue every 5 (five) minutes as needed.    [provider]  rosuvastatin (CRESTOR) 40 MG tablet Take 40 mg by mouth daily. 12/18/21   [provider]    Family History    Family History  Problem Relation Age of Onset   Hypertension Father    Heart attack Paternal Grandfather    Heart failure Paternal Grandfather    Heart failure Paternal Grandmother    Heart attack Paternal Grandmother    He indicated that his mother is alive. He indicated that his father is deceased. He indicated that the status of his paternal grandmother is unknown. He indicated that the status of his paternal grandfather is unknown.  Social History    Social History   Socioeconomic History   Marital status: Divorced    Spouse name: Not on file   Number of children: 2   Years of education: 14   Highest education level: Not on file  Occupational History   Occupation: Production designer, theatre/television/film at [employer]  Tobacco Use   Smoking status: Some Days    Current packs/day: 1.00    Average packs/day: 1 pack/day for 25.0 years (25.0 ttl pk-yrs)    Types: Cigarettes   Smokeless tobacco: Never   Tobacco comments:    cut back from 2 packs to half pack/day  Vaping Use   Vaping status: Never Used  Substance and Sexual Activity   Alcohol use: No   Drug use: No   Sexual activity: Yes    Partners: Male    Comment: declined condoms  Other Topics Concern   Not on file  Social History Narrative   Not on file   Social Drivers of Health   Financial Resource Strain: Not on file  Food Insecurity: Not on file  Transportation Needs: Not on file  Physical Activity: Not on file  Stress: Not on file  Social Connections: Not on file  Intimate Partner Violence: Not on file      Review of Systems    General:  No chills, fever, night sweats or weight changes.  Cardiovascular:  No chest pain, dyspnea on exertion, edema, orthopnea, palpitations, paroxysmal nocturnal dyspnea. Dermatological: No rash, lesions/masses Respiratory: No cough, dyspnea Urologic: No hematuria, dysuria Abdominal:   No nausea, vomiting, diarrhea, bright red blood per rectum, melena, or hematemesis Neurologic:  No visual changes, wkns, changes in mental status. All other systems reviewed and are otherwise negative except as noted above.  Physical Exam    VS:  BP 120/60 (BP  Location: Left Arm, Patient Position: Sitting, Cuff Size: Normal)   Pulse 67   Ht 6\' 1"  (1.854 m)   Wt 230 lb (104.3 kg)   BMI 30.34 kg/m  , BMI Body mass index is 30.34 kg/m. GEN: Well nourished, well developed, in no acute distress. HEENT: normal. Neck: Supple, no JVD, carotid bruits, or masses. Cardiac: RRR, no murmurs, rubs, or gallops. No clubbing, cyanosis, edema.  Radials/DP/PT 2+ and equal bilaterally.  Respiratory:  Respirations regular and unlabored, clear to auscultation bilaterally. GI: Soft, nontender, nondistended, BS + x 4. MS: no deformity or atrophy. Skin: warm and dry, no rash. Neuro:  Strength and sensation are intact. Psych: Normal affect.  Accessory Clinical Findings    Recent Labs: 05/20/2023: ALT 12; BUN 14; Creat 0.92; Potassium 3.9; Sodium 141   Recent Lipid Panel    Component Value Date/Time   CHOL 172 03/16/2022 1605   TRIG 179 (H) 03/16/2022 1605   HDL 38 (L) 03/16/2022 1605   CHOLHDL 4.5 03/16/2022 1605   LDLCALC 104 (H) 03/16/2022 1605         ECG personally reviewed by me today-  EKG Interpretation Date/Time:  Wednesday June 30 2023 15:28:10 EDT Ventricular Rate:  64 PR Interval:  210 QRS Duration:  110 QT Interval:  426 QTC Calculation: 439 R Axis:   -20  Text Interpretation: Sinus rhythm with 1st degree A-V block Incomplete left bundle branch block When  compared with ECG of 27-Oct-2005 12:52, PR interval has increased Incomplete left bundle branch block is now Present Confirmed by Edd Fabian [id number]) on 06/30/2023 3:30:32 PM      Echocardiogram 12/15/2021  IMPRESSIONS     1. Left ventricular ejection fraction, by estimation, is 60 to 65%. The  left ventricle has normal function. The left ventricle has no regional  wall motion abnormalities. Left ventricular diastolic parameters are  consistent with Grade II diastolic  dysfunction (pseudonormalization).   2. Right ventricular systolic function is normal. The right ventricular  size is normal.   3. Left atrial size was mild to moderately dilated.   4. The mitral valve is normal in structure. No evidence of mitral valve  regurgitation. No evidence of mitral stenosis.   5. DI 0.24, SVi 35 . The aortic valve is calcified. Aortic valve  regurgitation is mild. Moderate to severe aortic valve stenosis. Aortic  valve area, by VTI measures 0.83 cm. Aortic valve mean gradient measures  35.0 mmHg.   6. The inferior vena cava is normal in size with greater than 50%  respiratory variability, suggesting right atrial pressure of 3 mmHg.   FINDINGS   Left Ventricle: Left ventricular ejection fraction, by estimation, is 60  to 65%. The left ventricle has normal function. The left ventricle has no  regional wall motion abnormalities. The left ventricular internal cavity  size was normal in size. There is   borderline left ventricular hypertrophy. Left ventricular diastolic  parameters are consistent with Grade II diastolic dysfunction  (pseudonormalization).   Right Ventricle: The right ventricular size is normal. No increase in  right ventricular wall thickness. Right ventricular systolic function is  normal.   Left Atrium: Left atrial size was mild to moderately dilated.   Right Atrium: Right atrial size was normal in size.   Pericardium: There is no evidence of pericardial effusion.    Mitral Valve: The mitral valve is normal in structure. No evidence of  mitral valve regurgitation. No evidence of mitral valve stenosis.   Tricuspid Valve: The  tricuspid valve is normal in structure. Tricuspid  valve regurgitation is not demonstrated. No evidence of tricuspid  stenosis.   Aortic Valve: DI 0.24, SVi 35. The aortic valve is calcified. Aortic valve  regurgitation is mild. Aortic regurgitation PHT measures 568 msec.  Moderate to severe aortic stenosis is present. Aortic valve mean gradient  measures 35.0 mmHg. Aortic valve peak  gradient measures 57.2 mmHg. Aortic valve area, by VTI measures 0.83 cm.   Pulmonic Valve: The pulmonic valve was normal in structure. Pulmonic valve  regurgitation is not visualized. No evidence of pulmonic stenosis.   Aorta: The aortic root is normal in size and structure.   Venous: The inferior vena cava is normal in size with greater than 50%  respiratory variability, suggesting right atrial pressure of 3 mmHg.   IAS/Shunts: No atrial level shunt detected by color flow Doppler.       Assessment & Plan   1.  Dizziness-Notes intermittent episodes of dizziness which have been intermittent over the last several weeks.  He notices that they happen more often in the morning.    BP today 120/60.  He reports poor hydration.  He consumes coffee throughout the day.   Change positions slowly Increase p.o. hydration Lower extremity support stockings Order CBC, BMP  Hyperlipidemia-LDL 104 on12/18/23 High-fiber diet Continue aspirin, rosuvastatin  Coronary artery disease-denies exertional chest pain.    Has had cardiac catheterizations in 2011 and 2018.  He received subsequent RCA stenting with DES in 2018.  He now notes intermittent dizziness.  Will order Lexi scan for further prognostication. Heart healthy low-sodium diet Maintain physical activity Continue aspirin, rosuvastatin, lisinopril Order nuclear stress test Informed Consent    Shared Decision Making/Informed Consent The risks [chest pain, shortness of breath, cardiac arrhythmias, dizziness, blood pressure fluctuations, myocardial infarction, stroke/transient ischemic attack, nausea, vomiting, allergic reaction, radiation exposure, metallic taste sensation and life-threatening complications (estimated to be 1 in 10,000)], benefits (risk stratification, diagnosing coronary artery disease, treatment guidance) and alternatives of a nuclear stress test were discussed in detail with Mr. Maese and he agrees to proceed.     Cardiac murmur, aortic stenosis-systolic murmur heard along right sternal border 3/6.  Echocardiogram 9/13 showed LVEF of 60 to 65%, G2 DD, and moderate-severe aortic stenosis. Repeat echocardiogram   Disposition: Follow-up with Dr. Jens Som or me in 2 months.   Thomasene Ripple. Keziah Drotar NP-C     06/30/2023, 3:27 PM [facility] [address] Office [phone] Fax [phone]    I spent 14 minutes examining this patient, reviewing medications, and using patient centered shared decision making involving their cardiac care.   I spent  20 minutes reviewing past medical history,  medications, and prior cardiac tests.

## 2023-06-30 ENCOUNTER — Ambulatory Visit: Attending: General Practice | Admitting: General Practice

## 2023-06-30 ENCOUNTER — Encounter: Payer: Self-pay | Admitting: General Practice

## 2023-06-30 VITALS — BP 120/60 | HR 67 | Ht 73.0 in | Wt 230.0 lb

## 2023-06-30 DIAGNOSIS — I2510 Atherosclerotic heart disease of native coronary artery without angina pectoris: Secondary | ICD-10-CM

## 2023-06-30 DIAGNOSIS — R011 Cardiac murmur, unspecified: Secondary | ICD-10-CM

## 2023-06-30 DIAGNOSIS — R42 Dizziness and giddiness: Secondary | ICD-10-CM

## 2023-06-30 DIAGNOSIS — E782 Mixed hyperlipidemia: Secondary | ICD-10-CM | POA: Diagnosis not present

## 2023-06-30 NOTE — Patient Instructions (Addendum)
 Medication Instructions:  The current medical regimen is effective;  continue present plan and medications as directed. Please refer to the Current Medication list given to you today.  *If you need a refill on your cardiac medications before your next appointment, please call your pharmacy*  Lab Work: CBC AND BMET TODAY If you have labs (blood work) drawn today and your tests are completely normal, you will receive your results only by:  MyChart Message (if you have MyChart) OR A paper copy in the mail If you have any lab test that is abnormal or we need to change your treatment, we will call you to review the results.  Testing/Procedures: Your physician has requested that you have a lexiscan myoview. For further information please visit [email or web address] Please follow instruction sheet, as given.  Your physician has requested that you have an echocardiogram. Echocardiography is a painless test that uses sound waves to create images of your heart. It provides your doctor with information about the size and shape of your heart and how well your heart's chambers and valves are working. This procedure takes approximately one hour. There are no restrictions for this procedure. Please do NOT wear cologne, perfume, aftershave, or lotions (deodorant is allowed). Please arrive 15 minutes prior to your appointment time.  Please note: We ask at that you not bring children with you during ultrasound (echo/ vascular) testing. Due to room size and safety concerns, children are not allowed in the ultrasound rooms during exams. Our front office staff cannot provide observation of children in our lobby area while testing is being conducted. An adult accompanying a patient to their appointment will only be allowed in the ultrasound room at the discretion of the ultrasound technician under special circumstances. We apologize for any inconvenience.   Follow-Up: At [facility], you and your health needs  are our priority.  As part of our continuing mission to provide you with exceptional heart care, our providers are all part of one team.  This team includes your primary Cardiologist (physician) and Advanced Practice Providers or APPs (Physician Assistants and Nurse Practitioners) who all work together to provide you with the care you need, when you need it.  Your next appointment:   3 month(s)  Provider:   Olga Millers, MD  or Edd Fabian, FNP        Other Instructions INCREASE HYDRATION Steps to Quit Smoking Smoking tobacco is the leading cause of preventable death. It can affect almost every organ in the body. Smoking puts you and people around you at risk for many serious, long-lasting (chronic) diseases. Quitting smoking can be hard, but it is one of the best things that you can do for your health. It is never too late to quit. Do not give up if you cannot quit the first time. Some people need to try many times to quit. Do your best to stick to your quit plan, and talk with your doctor if you have any questions or concerns. How do I get ready to quit? Pick a date to quit. Set a date within the next 2 weeks to give you time to prepare. Write down the reasons why you are quitting. Keep this list in places where you will see it often. Tell your family, friends, and co-workers that you are quitting. Their support is important. Talk with your doctor about the choices that may help you quit. Find out if your health insurance will pay for these treatments. Know the people, places, things, and  activities that make you want to smoke (triggers). Avoid them. What first steps can I take to quit smoking? Throw away all cigarettes at home, at work, and in your car. Throw away the things that you use when you smoke, such as ashtrays and lighters. Clean your car. Empty the ashtray. Clean your home, including curtains and carpets. What can I do to help me quit smoking? Talk with your doctor about  taking medicines and seeing a counselor. You are more likely to succeed when you do both. If you are pregnant or breastfeeding: Talk with your doctor about counseling or other ways to quit smoking. Do not take medicine to help you quit smoking unless your doctor tells you to. Quit right away Quit smoking completely, instead of slowly cutting back on how much you smoke over a period of time. Stopping smoking right away may be more successful than slowly quitting. Go to counseling. In-person is best if this is an option. You are more likely to quit if you go to counseling sessions regularly. Take medicine You may take medicines to help you quit. Some medicines need a prescription, and some you can buy over-the-counter. Some medicines may contain a drug called nicotine to replace the nicotine in cigarettes. Medicines may: Help you stop having the desire to smoke (cravings). Help to stop the problems that come when you stop smoking (withdrawal symptoms). Your doctor may ask you to use: Nicotine patches, gum, or lozenges. Nicotine inhalers or sprays. Non-nicotine medicine that you take by mouth. Find resources Find resources and other ways to help you quit smoking and remain smoke-free after you quit. They include: Online chats with a Veterinary surgeon. Phone quitlines. Printed Materials engineer. Support groups or group counseling. Text messaging programs. Mobile phone apps. Use apps on your mobile phone or tablet that can help you stick to your quit plan. Examples of free services include Quit Guide from the CDC and smokefree.gov  What can I do to make it easier to quit?  Talk to your family and friends. Ask them to support and encourage you. Call a phone quitline, such as 1-800-QUIT-NOW, reach out to support groups, or work with a Veterinary surgeon. Ask people who smoke to not smoke around you. Avoid places that make you want to smoke, such as: Bars. Parties. Smoke-break areas at work. Spend time  with people who do not smoke. Lower the stress in your life. Stress can make you want to smoke. Try these things to lower stress: Getting regular exercise. Doing deep-breathing exercises. Doing yoga. Meditating. What benefits will I see if I quit smoking? Over time, you may have: A better sense of smell and taste. Less coughing and sore throat. A slower heart rate. Lower blood pressure. Clearer skin. Better breathing. Fewer sick days. Summary Quitting smoking can be hard, but it is one of the best things that you can do for your health. Do not give up if you cannot quit the first time. Some people need to try many times to quit. When you decide to quit smoking, make a plan to help you succeed. Quit smoking right away, not slowly over a period of time. When you start quitting, get help and support to keep you smoke-free. This information is not intended to replace advice given to you by your health care provider. Make sure you discuss any questions you have with your health care provider. Document Revised: 03/07/2021 Document Reviewed: 03/07/2021 Elsevier Patient Education  2024 [employer].      1st  Floor: - Lobby - Registration  - Pharmacy  - Lab - Cafe  2nd Floor: - PV Lab - Diagnostic Testing (echo, CT, nuclear med)  3rd Floor: - Vacant  4th Floor: - TCTS (cardiothoracic surgery) - AFib Clinic - Structural Heart Clinic - Vascular Surgery  - Vascular Ultrasound  5th Floor: - HeartCare Cardiology (general and EP) - Clinical Pharmacy for coumadin, hypertension, lipid, weight-loss medications, and med management appointments    Valet parking services will be available as well.

## 2023-07-01 LAB — CBC
Hematocrit: 39.8 % (ref 37.5–51.0)
Hemoglobin: 14.1 g/dL (ref 13.0–17.7)
MCH: 33.9 pg — ABNORMAL HIGH (ref 26.6–33.0)
MCHC: 35.4 g/dL (ref 31.5–35.7)
MCV: 96 fL (ref 79–97)
Platelets: 197 10*3/uL (ref 150–450)
RBC: 4.16 x10E6/uL (ref 4.14–5.80)
RDW: 12.7 % (ref 11.6–15.4)
WBC: 6.3 10*3/uL (ref 3.4–10.8)

## 2023-07-01 LAB — BASIC METABOLIC PANEL WITH GFR
BUN/Creatinine Ratio: 11 (ref 10–24)
BUN: 11 mg/dL (ref 8–27)
CO2: 25 mmol/L (ref 20–29)
Calcium: 9 mg/dL (ref 8.6–10.2)
Chloride: 103 mmol/L (ref 96–106)
Creatinine, Ser: 1.02 mg/dL (ref 0.76–1.27)
Glucose: 82 mg/dL (ref 70–99)
Potassium: 4.6 mmol/L (ref 3.5–5.2)
Sodium: 141 mmol/L (ref 134–144)
eGFR: 82 mL/min/{1.73_m2} (ref >59)

## 2023-07-12 ENCOUNTER — Ambulatory Visit: Payer: Self-pay | Admitting: General Surgery

## 2023-07-12 NOTE — H&P (Signed)
 REFERRING PHYSICIAN:  Jeanine Luz, NP   PROVIDER:  Elenora Gamma, MD   MRN: [id number] DOB: 12/09/1960 DATE OF ENCOUNTER: 07/12/2023   Subjective    Chief Complaint: New Consultation       History of Present Illness: Ethan Farrell is a 63 y.o. male who is seen today as an office consultation at the request of PA Calone for evaluation of New Consultation .  63 year old male with HIV who presents to the office for abnormal Pap test.  This was performed 05/20/2023 and showed LSIL.     Review of Systems: A complete review of systems was obtained from the patient.  I have reviewed this information and discussed as appropriate with the patient.  See HPI as well for other ROS.       Medical History: Past Medical History  No past medical history on file.     Problem List     Patient Active Problem List  Diagnosis   Arteriosclerosis of coronary artery   Bilateral pulmonary infiltrates on chest x-ray   Cigarette smoker   Class 2 obesity due to excess calories with body mass index (BMI) of 38.0 to 38.9 in adult   Diabetic peripheral neuropathy (CMS/HHS-HCC)   Essential hypertension   HSV infection   Hypothyroidism   Other specified hypothyroidism   Syphilis   HIV -NO current or prior AIDS criteria or opportunistic infection, Asymptomatic, active antiretroviral treatment  (CMS/HHS-HCC)        Past Surgical History  No past surgical history on file.      Allergies       Allergies  Allergen Reactions   Prednisone Other (See Comments)      Itching, flushing   Sulfa (Sulfonamide Antibiotics) Itching        Medications Ordered Prior to Encounter        Current Outpatient Medications on File Prior to Visit  Medication Sig Dispense Refill   BIKTARVY 50-200-25 mg tablet Take 1 tablet by mouth once daily       levothyroxine (SYNTHROID) 175 MCG tablet Take 1 tablet by mouth every morning before breakfast (0630)       lisinopriL (ZESTRIL) 10 MG tablet Take 10  mg by mouth once daily       metFORMIN (GLUCOPHAGE) 1000 MG tablet TAKE 1 TABLET BY MOUTH EVERY DAY WITH A MEAL        No current facility-administered medications on file prior to visit.        Family History  No family history on file.      Tobacco Use History  Social History       Tobacco Use  Smoking Status Not on file  Smokeless Tobacco Not on file        Social History  Social History        Socioeconomic History   Marital status: Widowed    Social Drivers of Health        Housing Stability: Unknown (07/12/2023)    Housing Stability Vital Sign     Homeless in the Last Year: No        Objective:         Vitals:    07/12/23 1526  BP: 132/70  Pulse: 53  Temp: 36.8 C (98.3 F)  SpO2: 98%  Weight: 100.2 kg (220 lb 12.8 oz)  Height: 185.4 cm (6\' 1" )  PainSc: 0-No pain      Exam Gen: NAD Abd: soft     Labs, Imaging and Diagnostic  Testing: CD4 670 HIV ND   Assessment and Plan:  Diagnoses and all orders for this visit:   LGSIL Pap smear of anus     I recommend proceeding with high-resolution anoscopy with possible biopsy or ablation.  We discussed this in detail today including risk of postoperative pain bleeding and recurrence.  All questions were answered.   Fernande Howells, MD Colon and Rectal Surgery [facility]

## 2023-07-13 ENCOUNTER — Encounter (HOSPITAL_COMMUNITY): Payer: Self-pay

## 2023-07-13 NOTE — Patient Instructions (Addendum)
 SURGICAL WAITING ROOM VISITATION  Patients having surgery or a procedure may have no more than 2 support people in the waiting area - these visitors may rotate.    Children under the age of 37 must have an adult with them who is not the patient.  Due to an increase in RSV and influenza rates and associated hospitalizations, children ages 27 and under may not visit patients in [facility] hospitals.  Visitors with respiratory illnesses are discouraged from visiting and should remain at home.  If the patient needs to stay at the hospital during part of their recovery, the visitor guidelines for inpatient rooms apply. Pre-op nurse will coordinate an appropriate time for 1 support person to accompany patient in pre-op.  This support person may not rotate.    Please refer to the [facility] website for the visitor guidelines for Inpatients (after your surgery is over and you are in a regular room).       Your procedure is scheduled on: 07-28-23   Report to [facility] Main Entrance    Report to admitting at     11:00  AM   Call this number if you have problems the morning of surgery [phone]   Do not eat food :After Midnight.   After Midnight you may have the following liquids until _0715____ AM/ DAY OF SURGERY   then nothing by mouth  Water                                                                        Sports drinks like Gatorade (NO RED)                          If you have questions, please contact your surgeon's office.   FOLLOW BOWEL PREP AND ANY ADDITIONAL PRE OP INSTRUCTIONS YOU RECEIVED FROM YOUR SURGEON'S OFFICE!!!     Oral Hygiene is also important to reduce your risk of infection.                                    Remember - BRUSH YOUR TEETH THE MORNING OF SURGERY WITH YOUR REGULAR TOOTHPASTE  DENTURES WILL BE REMOVED PRIOR TO SURGERY PLEASE DO NOT APPLY "Poly grip" OR ADHESIVES!!!   Do NOT smoke after Midnight   Stop all vitamins and herbal  supplements 7 days before surgery.   Take these medicines the morning of surgery with A SIP OF WATER: Biktarvy ,  Levothyroxine   DO NOT TAKE ANY ORAL DIABETIC MEDICATIONS DAY OF YOUR SURGERY  HOLD MOUNJARO ONE WEEK PRIOR TO SURGERY    How to Manage Your Diabetes Before and After Surgery  Why is it important to control my blood sugar before and after surgery? Improving blood sugar levels before and after surgery helps healing and can limit problems. A way of improving blood sugar control is eating a healthy diet by:  Eating less sugar and carbohydrates  Increasing activity/exercise  Talking with your doctor about reaching your blood sugar goals High blood sugars (greater than 180 mg/dL) can raise your risk of infections and slow your recovery, so you will need to  focus on controlling your diabetes during the weeks before surgery. Make sure that the doctor who takes care of your diabetes knows about your planned surgery including the date and location.  How do I manage my blood sugar before surgery? Check your blood sugar at least 4 times a day, starting 2 days before surgery, to make sure that the level is not too high or low. Check your blood sugar the morning of your surgery when you wake up and every 2 hours until you get to the Short Stay unit. If your blood sugar is less than 70 mg/dL, you will need to treat for low blood sugar: Do not take insulin. Treat a low blood sugar (less than 70 mg/dL) with  cup of clear juice (cranberry or apple), 4 glucose tablets, OR glucose gel. Recheck blood sugar in 15 minutes after treatment (to make sure it is greater than 70 mg/dL). If your blood sugar is not greater than 70 mg/dL on recheck, call [phone] for further instructions. Report your blood sugar to the short stay nurse when you get to Short Stay.  If you are admitted to the hospital after surgery: Your blood sugar will be checked by the staff and you will probably be given insulin  after surgery (instead of oral diabetes medicines) to make sure you have good blood sugar levels. The goal for blood sugar control after surgery is 80-180 mg/dL.   WHAT DO I DO ABOUT MY DIABETES MEDICATION?  Do not take oral diabetes medicines (pills) the morning of surgery.   DO NOT TAKE THE FOLLOWING 7 DAYS PRIOR TO SURGERY: Ozempic, Wegovy, Rybelsus (Semaglutide), Byetta (exenatide), Bydureon (exenatide ER), Victoza , Saxenda (liraglutide ), or Trulicity  (dulaglutide ) Mounjaro (Tirzepatide) Adlyxin (Lixisenatide), Polyethylene Glycol Loxenatide.                                You may not have any metal on your body including hair pins, jewelry, and body piercing             Do not wear lotions, powders, /cologne, or deodorant              Men may shave face and neck.   Do not bring valuables to the hospital. [facility] IS NOT             RESPONSIBLE   FOR VALUABLES.   Contacts, glasses, dentures or bridgework may not be worn into surgery.   Bring small overnight bag day of surgery.   DO NOT BRING YOUR HOME MEDICATIONS TO THE HOSPITAL. PHARMACY WILL DISPENSE MEDICATIONS LISTED ON YOUR MEDICATION LIST TO YOU DURING YOUR ADMISSION IN THE HOSPITAL!    Patients discharged on the day of surgery will not be allowed to drive home.  Someone NEEDS to stay with you for the first 24 hours after anesthesia.   Special Instructions: Bring a copy of your healthcare power of attorney and living will documents the day of surgery if you haven't scanned them before.              Please read over the following fact sheets you were given: IF YOU HAVE QUESTIONS ABOUT YOUR PRE-OP INSTRUCTIONS PLEASE CALL [phone]    If you test positive for Covid or have been in contact with anyone that has tested positive in the last 10 days please notify you surgeon.    [place] - Preparing for Surgery Before surgery, you can play an  important role.  Because skin is not sterile, your skin needs to be as  free of germs as possible.  You can reduce the number of germs on your skin by washing with CHG (chlorahexidine gluconate) soap before surgery.  CHG is an antiseptic cleaner which kills germs and bonds with the skin to continue killing germs even after washing. Please DO NOT use if you have an allergy to CHG or antibacterial soaps.  If your skin becomes reddened/irritated stop using the CHG and inform your nurse when you arrive at Short Stay. Do not shave (including legs and underarms) for at least 48 hours prior to the first CHG shower.  You may shave your face/neck. Please follow these instructions carefully:  1.  Shower with CHG Soap the night before surgery and the  morning of Surgery.  2.  If you choose to wash your hair, wash your hair first as usual with your  normal  shampoo.  3.  After you shampoo, rinse your hair and body thoroughly to remove the  shampoo.                           4.  Use CHG as you would any other liquid soap.  You can apply chg directly  to the skin and wash                       Gently with a scrungie or clean washcloth.  5.  Apply the CHG Soap to your body ONLY FROM THE NECK DOWN.   Do not use on face/ open                           Wound or open sores. Avoid contact with eyes, ears mouth and genitals (private parts).                       Wash face,  Genitals (private parts) with your normal soap.             6.  Wash thoroughly, paying special attention to the area where your surgery  will be performed.  7.  Thoroughly rinse your body with warm water from the neck down.  8.  DO NOT shower/wash with your normal soap after using and rinsing off  the CHG Soap.                9.  Pat yourself dry with a clean towel.            10.  Wear clean pajamas.            11.  Place clean sheets on your bed the night of your first shower and do not  sleep with pets. Day of Surgery : Do not apply any lotions/deodorants the morning of surgery.  Please wear clean clothes to the  hospital/surgery center.  FAILURE TO FOLLOW THESE INSTRUCTIONS MAY RESULT IN THE CANCELLATION OF YOUR SURGERY PATIENT SIGNATURE_________________________________  NURSE SIGNATURE__________________________________  ________________________________________________________________________

## 2023-07-13 NOTE — Progress Notes (Addendum)
 PCP - [facility]  Cardiologist - Alexandria Angel, MD  LOV Lawana Pray ,NP 06-30-23 epic ID-LOV Annamaria Barrette, FNP 05-20-23 epic  PPM/ICD -  Device Orders -  Rep Notified -   Chest x-ray - CT chest 06-14-23 epic EKG - 06-30-23 epic Stress Test -  ECHO - 12-15-21 epic Cardiac Cath -  LABS -BMP, CBC 06-30-23 epic  Sleep Study -  CPAP -   Fasting Blood Sugar - 100 Checks Blood Sugar __1_-2__ times a week  Blood Thinner Instructions: Aspirin Instructions:81 MG STOPPED BEFORE PREOP  ERAS Protcol - PRE-SURGERY N/A  COVID vaccine -YES  MOUNJARO LAST DOSE 4-12 25   Activity--Able to climb a flight of stairs without CP or SOB  Anesthesia review: HTN, MI,CAD/Stentx2, OSA, HIV, DM2, Moderate to severe aortic stenosis  Patient denies shortness of breath, fever, cough and chest pain at PAT appointment   All instructions explained to the patient, with a verbal understanding of the material. Patient agrees to go over the instructions while at home for a better understanding. Patient also instructed to self quarantine after being tested for COVID-19. The opportunity to ask questions was provided.

## 2023-07-21 ENCOUNTER — Encounter (HOSPITAL_COMMUNITY): Payer: Self-pay

## 2023-07-21 ENCOUNTER — Encounter (HOSPITAL_COMMUNITY)
Admission: RE | Admit: 2023-07-21 | Discharge: 2023-07-21 | Disposition: A | Discharge status: Home / Self Care | Source: Ambulatory Visit | Attending: General Surgery | Admitting: General Surgery

## 2023-07-21 ENCOUNTER — Other Ambulatory Visit: Payer: Self-pay

## 2023-07-21 VITALS — BP 165/78 | HR 67 | Temp 98.1°F | Resp 16 | Ht 72.0 in | Wt 225.0 lb

## 2023-07-21 DIAGNOSIS — Z7985 Long-term (current) use of injectable non-insulin antidiabetic drugs: Secondary | ICD-10-CM | POA: Insufficient documentation

## 2023-07-21 DIAGNOSIS — E119 Type 2 diabetes mellitus without complications: Secondary | ICD-10-CM | POA: Diagnosis not present

## 2023-07-21 DIAGNOSIS — Z79620 Long term (current) use of immunosuppressive biologic: Secondary | ICD-10-CM | POA: Insufficient documentation

## 2023-07-21 DIAGNOSIS — Z955 Presence of coronary angioplasty implant and graft: Secondary | ICD-10-CM | POA: Diagnosis not present

## 2023-07-21 DIAGNOSIS — I352 Nonrheumatic aortic (valve) stenosis with insufficiency: Secondary | ICD-10-CM | POA: Diagnosis not present

## 2023-07-21 DIAGNOSIS — F17200 Nicotine dependence, unspecified, uncomplicated: Secondary | ICD-10-CM | POA: Diagnosis not present

## 2023-07-21 DIAGNOSIS — I2510 Atherosclerotic heart disease of native coronary artery without angina pectoris: Secondary | ICD-10-CM | POA: Diagnosis not present

## 2023-07-21 DIAGNOSIS — Z01812 Encounter for preprocedural laboratory examination: Secondary | ICD-10-CM | POA: Diagnosis present

## 2023-07-21 DIAGNOSIS — Z21 Asymptomatic human immunodeficiency virus [HIV] infection status: Secondary | ICD-10-CM | POA: Insufficient documentation

## 2023-07-21 DIAGNOSIS — G4733 Obstructive sleep apnea (adult) (pediatric): Secondary | ICD-10-CM | POA: Insufficient documentation

## 2023-07-21 DIAGNOSIS — R85612 Low grade squamous intraepithelial lesion on cytologic smear of anus (LGSIL): Secondary | ICD-10-CM | POA: Insufficient documentation

## 2023-07-21 DIAGNOSIS — I10 Essential (primary) hypertension: Secondary | ICD-10-CM | POA: Insufficient documentation

## 2023-07-21 DIAGNOSIS — Z7984 Long term (current) use of oral hypoglycemic drugs: Secondary | ICD-10-CM | POA: Diagnosis not present

## 2023-07-21 HISTORY — DX: Myoneural disorder, unspecified: G70.9

## 2023-07-21 HISTORY — DX: Atherosclerotic heart disease of native coronary artery without angina pectoris: I25.10

## 2023-07-21 HISTORY — DX: Pneumonia, unspecified organism: J18.9

## 2023-07-21 HISTORY — DX: Cardiac murmur, unspecified: R01.1

## 2023-07-21 LAB — HEMOGLOBIN A1C
Hgb A1c MFr Bld: 4.7 % — ABNORMAL LOW (ref 4.8–5.6)
Mean Plasma Glucose: 88.19 mg/dL

## 2023-07-21 LAB — GLUCOSE, CAPILLARY: Glucose-Capillary: 86 mg/dL (ref 70–99)

## 2023-07-22 ENCOUNTER — Telehealth: Payer: Self-pay | Admitting: Cardiology

## 2023-07-22 ENCOUNTER — Encounter (HOSPITAL_COMMUNITY): Payer: Self-pay | Admitting: Physician Assistant

## 2023-07-22 NOTE — Progress Notes (Signed)
 Anesthesia Chart Review  Case: [id number] Date/Time: 07/28/23 1300   Procedure: ANOSCOPY, HIGH RESOLUTION - HIGH RESOLUTION ANOSCOPY POSSIBLE BIOPSY OR ABLATION   Anesthesia type: Monitor Anesthesia Care   Diagnosis: LGSIL Pap smear of anus [R85.612]   Pre-op diagnosis: PAP SMEAR OF ANUS   Location: [facility] ROOM 02 / [facility]   Surgeons: Joyce Nixon, MD       DISCUSSION:62 y.o. smoker with h/o HTN, OSA, DM II, HIV, moderate to severe AS, CAD s/p PCI to RCA 2011, DES to RCA 2018, scheduled for above procedure 07/28/2023 with Dr. Joyce Nixon.   Pt last seen by cardiology 06/30/23.  Pt with complaints of dizziness at this time.  Stress test and updated Echo ordered.  Currently, both of these tests are scheduled after procedure with Dr. Andy Bannister.  VS: BP (!) 165/78   Pulse 67   Temp 36.7 C (Oral)   Resp 16   Ht 6' (1.829 m)   Wt 102.1 kg   SpO2 97%   BMI 30.52 kg/m   PROVIDERS: Georgean Kindle, MD is PCP   Cardiologist - Alexandria Angel, MD  LABS: Labs reviewed: Acceptable for surgery. (all labs ordered are listed, but only abnormal results are displayed)  Labs Reviewed  HEMOGLOBIN A1C - Abnormal; Notable for the following components:      Result Value   Hgb A1c MFr Bld 4.7 (*)    All other components within normal limits  GLUCOSE, CAPILLARY     IMAGES:   EKG:   CV: Echo 12/15/2021 1. Left ventricular ejection fraction, by estimation, is 60 to 65%. The  left ventricle has normal function. The left ventricle has no regional  wall motion abnormalities. Left ventricular diastolic parameters are  consistent with Grade II diastolic  dysfunction (pseudonormalization).   2. Right ventricular systolic function is normal. The right ventricular  size is normal.   3. Left atrial size was mild to moderately dilated.   4. The mitral valve is normal in structure. No evidence of mitral valve  regurgitation. No evidence of mitral stenosis.   5. DI 0.24, SVi 35 . The aortic valve is  calcified. Aortic valve  regurgitation is mild. Moderate to severe aortic valve stenosis. Aortic  valve area, by VTI measures 0.83 cm. Aortic valve mean gradient measures  35.0 mmHg.   6. The inferior vena cava is normal in size with greater than 50%  respiratory variability, suggesting right atrial pressure of 3 mmHg.  Past Medical History:  Diagnosis Date   Asthma    Coronary artery disease    DM type 2 (diabetes mellitus, type 2) (HCC)    Heart attack (HCC) 11/2009   2 stents   Heart murmur    HIV infection (HCC)    undetectable   HTN (hypertension)    Hypercholesterolemia    Hypothyroid    Neuromuscular disorder (HCC)    neuropathy fingers   OSA (obstructive sleep apnea)    Pneumonia    Seasonal allergies     Past Surgical History:  Procedure Laterality Date   APPENDECTOMY     BACK SURGERY     CARPAL TUNNEL RELEASE Left    KNEE SURGERY     left   right shoulder     right shoulder spurs   ROTATOR CUFF REPAIR     right   UMBILICAL HERNIA REPAIR      MEDICATIONS:  aspirin 81 MG chewable tablet   bictegravir-emtricitabine-tenofovir AF (BIKTARVY ) 50-200-25 MG TABS tablet  levothyroxine  (SYNTHROID ) 175 MCG tablet   lisinopril  (ZESTRIL ) 20 MG tablet   metFORMIN  (GLUCOPHAGE ) 1000 MG tablet   NON FORMULARY   tirzepatide (MOUNJARO) 15 MG/0.5ML Pen   No current facility-administered medications for this encounter.

## 2023-07-22 NOTE — Telephone Encounter (Signed)
   Pre-operative Risk Assessment    Patient Name: Ethan Farrell  DOB: Apr 30, 1960 MRN: [id number]   Date of last office visit: 06/30/2023 Date of next office visit: 10/18/2023   Request for Surgical Clearance    Procedure:   High resolution anoscopy  Date of Surgery:  Clearance 07/28/23                                Surgeon:  Dr. Joyce Nixon Surgeon's Group or Practice Name:  [facility] Phone number:  [phone] Fax number:  [phone]   Type of Clearance Requested:   - Medical    Type of Anesthesia:   Monitor anesthesia care   Additional requests/questions:   "Patient is scheduled for echocardiogram and myocardial perfusion on 08/04/2023"  Signed, Lacey Pian   07/22/2023, 4:28 PM

## 2023-07-26 NOTE — Telephone Encounter (Signed)
 Follow Up:   Ethan Farrell called to let you know that they will still need the clearance , but not for 07-28-23 She says she will need clearance after patient complete her tests that are scheduled

## 2023-07-27 NOTE — Telephone Encounter (Signed)
 Patient given detailed instructions per Myocardial Perfusion Study Information Sheet for the test on 08/04/23 Patient notified to arrive 15 minutes early and that it is imperative to arrive on time for appointment to keep from having the test rescheduled.  If you need to cancel or reschedule your appointment, please call the office within 24 hours of your appointment. . Patient verbalized understanding.Ethan Farrell

## 2023-07-28 ENCOUNTER — Encounter (HOSPITAL_COMMUNITY): Admission: RE | Payer: Self-pay | Source: Ambulatory Visit

## 2023-07-28 ENCOUNTER — Ambulatory Visit (HOSPITAL_COMMUNITY): Admission: RE | Admit: 2023-07-28 | Source: Ambulatory Visit | Admitting: General Surgery

## 2023-07-28 SURGERY — ANOSCOPY, HIGH RESOLUTION
Anesthesia: Monitor Anesthesia Care

## 2023-07-28 NOTE — Telephone Encounter (Signed)
 Testing scheduled 08/04/23. Preop team will revisit after this time. Will route this message to Lawana Pray to relay his thoughts to preop box once these studies have been completed. Will retain in preop box.

## 2023-07-30 ENCOUNTER — Other Ambulatory Visit: Payer: Self-pay | Admitting: General Practice

## 2023-07-30 DIAGNOSIS — I2510 Atherosclerotic heart disease of native coronary artery without angina pectoris: Secondary | ICD-10-CM

## 2023-08-03 ENCOUNTER — Other Ambulatory Visit: Payer: Self-pay | Admitting: General Practice

## 2023-08-03 DIAGNOSIS — I2510 Atherosclerotic heart disease of native coronary artery without angina pectoris: Secondary | ICD-10-CM

## 2023-08-04 ENCOUNTER — Ambulatory Visit (HOSPITAL_COMMUNITY): Attending: Cardiology

## 2023-08-04 ENCOUNTER — Ambulatory Visit (HOSPITAL_COMMUNITY)

## 2023-08-04 DIAGNOSIS — I2510 Atherosclerotic heart disease of native coronary artery without angina pectoris: Secondary | ICD-10-CM | POA: Diagnosis present

## 2023-08-04 DIAGNOSIS — I352 Nonrheumatic aortic (valve) stenosis with insufficiency: Secondary | ICD-10-CM | POA: Diagnosis not present

## 2023-08-04 LAB — ECHOCARDIOGRAM COMPLETE
AR max vel: 0.76 cm2
AV Area VTI: 0.75 cm2
AV Area mean vel: 0.67 cm2
AV Mean grad: 34 mmHg
AV Peak grad: 53.3 mmHg
Ao pk vel: 3.65 m/s
Area-P 1/2: 3.81 cm2
P 1/2 time: 507 ms
S' Lateral: 3.8 cm

## 2023-08-04 LAB — MYOCARDIAL PERFUSION IMAGING
Peak HR: 91 {beats}/min
Rest HR: 63 {beats}/min
Rest Nuclear Isotope Dose: 10.9 mCi
SDS: 1
SRS: 1
SSS: 1
ST Depression (mm): 0 mm
Stress Nuclear Isotope Dose: 29.7 mCi

## 2023-08-04 MED ORDER — TECHNETIUM TC 99M TETROFOSMIN IV KIT
29.7000 | PACK | Freq: Once | INTRAVENOUS | Status: AC | PRN
  Administered 2023-08-04: 29.7 via INTRAVENOUS

## 2023-08-04 MED ORDER — TECHNETIUM TC 99M TETROFOSMIN IV KIT
10.9000 | PACK | Freq: Once | INTRAVENOUS | Status: AC | PRN
  Administered 2023-08-04: 10.9 via INTRAVENOUS

## 2023-08-04 MED ORDER — REGADENOSON 0.4 MG/5ML IV SOLN
INTRAVENOUS | Status: AC
  Filled 2023-08-04: qty 5

## 2023-08-04 MED ORDER — REGADENOSON 0.4 MG/5ML IV SOLN
0.4000 mg | Freq: Once | INTRAVENOUS | Status: AC
  Administered 2023-08-04: 0.4 mg via INTRAVENOUS

## 2023-08-05 NOTE — Telephone Encounter (Signed)
     Primary Cardiologist: Alexandria Angel, MD  Chart reviewed as part of pre-operative protocol coverage. Given past medical history and time since last visit, based on ACC/AHA guidelines, Ethan Farrell would be at acceptable risk for the planned procedure without further cardiovascular testing.   Had reassuring echocardiogram and stress testing.  He is low risk for upcoming surgery.  He may proceed without further cardiac testing.  I will route this recommendation to the requesting party via Epic fax function and remove from pre-op pool.  Please call with questions.  Chet Cota. Wyolene Weimann NP-C     08/05/2023, 6:13 AM [facility] [address] Office [phone] Fax [phone]

## 2023-08-17 NOTE — Progress Notes (Signed)
 The 10-year ASCVD risk score (Arnett DK, et al., 2019) is: 49.9%   Values used to calculate the score:     Age: 63 years     Sex: Male     Is Non-Hispanic African American: No     Diabetic: Yes     Tobacco smoker: Yes     Systolic Blood Pressure: 165 mmHg     Is BP treated: Yes     HDL Cholesterol: 38 mg/dL     Total Cholesterol: 172 mg/dL  No current statin therapy, next appointment note updated.  Zadok Holaway, BSN, RN

## 2023-08-24 ENCOUNTER — Other Ambulatory Visit: Payer: Self-pay | Admitting: Cardiology

## 2023-08-24 DIAGNOSIS — I10 Essential (primary) hypertension: Secondary | ICD-10-CM

## 2023-09-06 NOTE — Patient Instructions (Signed)
 SURGICAL WAITING ROOM VISITATION  Patients having surgery or a procedure may have no more than 2 support people in the waiting area - these visitors may rotate.    Children under the age of 58 must have an adult with them who is not the patient.  Visitors with respiratory illnesses are discouraged from visiting and should remain at home.  If the patient needs to stay at the hospital during part of their recovery, the visitor guidelines for inpatient rooms apply. Pre-op nurse will coordinate an appropriate time for 1 support person to accompany patient in pre-op.  This support person may not rotate.    Please refer to the [facility] website for the visitor guidelines for Inpatients (after your surgery is over and you are in a regular room).    Your procedure is scheduled on: 09/08/23   Report to [facility] Main Entrance    Report to admitting at 7:45 AM   Call this number if you have problems the morning of surgery [phone]   Do not eat food or drink liquids :After Midnight.          If you have questions, please contact your surgeon's office.   FOLLOW BOWEL PREP AND ANY ADDITIONAL PRE OP INSTRUCTIONS YOU RECEIVED FROM YOUR SURGEON'S OFFICE!!!     Oral Hygiene is also important to reduce your risk of infection.                                    Remember - BRUSH YOUR TEETH THE MORNING OF SURGERY WITH YOUR REGULAR TOOTHPASTE  DENTURES WILL BE REMOVED PRIOR TO SURGERY PLEASE DO NOT APPLY "Poly grip" OR ADHESIVES!!!   Do NOT smoke after Midnight   Stop all vitamins and herbal supplements 7 days before surgery.   Take these medicines the morning of surgery with A SIP OF WATER: Biktarvy , Levothyroxine    DO NOT TAKE ANY ORAL DIABETIC MEDICATIONS DAY OF YOUR SURGERY  How to Manage Your Diabetes Before and After Surgery  Why is it important to control my blood sugar before and after surgery? Improving blood sugar levels before and after surgery helps healing and can  limit problems. A way of improving blood sugar control is eating a healthy diet by:  Eating less sugar and carbohydrates  Increasing activity/exercise  Talking with your doctor about reaching your blood sugar goals High blood sugars (greater than 180 mg/dL) can raise your risk of infections and slow your recovery, so you will need to focus on controlling your diabetes during the weeks before surgery. Make sure that the doctor who takes care of your diabetes knows about your planned surgery including the date and location.  How do I manage my blood sugar before surgery? Check your blood sugar at least 4 times a day, starting 2 days before surgery, to make sure that the level is not too high or low. Check your blood sugar the morning of your surgery when you wake up and every 2 hours until you get to the Short Stay unit. If your blood sugar is less than 70 mg/dL, you will need to treat for low blood sugar: Do not take insulin. Treat a low blood sugar (less than 70 mg/dL) with  cup of clear juice (cranberry or apple), 4 glucose tablets, OR glucose gel. Recheck blood sugar in 15 minutes after treatment (to make sure it is greater than 70 mg/dL). If your  blood sugar is not greater than 70 mg/dL on recheck, call [phone] for further instructions. Report your blood sugar to the short stay nurse when you get to Short Stay.  If you are admitted to the hospital after surgery: Your blood sugar will be checked by the staff and you will probably be given insulin after surgery (instead of oral diabetes medicines) to make sure you have good blood sugar levels. The goal for blood sugar control after surgery is 80-180 mg/dL.   WHAT DO I DO ABOUT MY DIABETES MEDICATION?  Do not take oral diabetes medicines (pills) the morning of surgery.  Do not take Mounjaro after 08/31/23.  THE DAY BEFORE SURGERY, take Metformin  as prescribed.       THE MORNING OF SURGERY, do not take Metformin .   DO NOT TAKE THE  FOLLOWING 7 DAYS PRIOR TO SURGERY: Ozempic, Wegovy, Rybelsus (Semaglutide), Byetta (exenatide), Bydureon (exenatide ER), Victoza , Saxenda (liraglutide ), or Trulicity  (dulaglutide ) Mounjaro (Tirzepatide) Adlyxin (Lixisenatide), Polyethylene Glycol Loxenatide.  Reviewed and Endorsed by [facility] Patient Education Committee, August 2015  Bring CPAP mask and tubing day of surgery.                              You may not have any metal on your body including jewelry, and body piercing             Do not wear lotions, powders, cologne, or deodorant              Men may shave face and neck.   Do not bring valuables to the hospital. [facility] IS NOT             RESPONSIBLE   FOR VALUABLES.   Contacts, glasses, dentures or bridgework may not be worn into surgery.  DO NOT BRING YOUR HOME MEDICATIONS TO THE HOSPITAL. PHARMACY WILL DISPENSE MEDICATIONS LISTED ON YOUR MEDICATION LIST TO YOU DURING YOUR ADMISSION IN THE HOSPITAL!    Patients discharged on the day of surgery will not be allowed to drive home.  Someone NEEDS to stay with you for the first 24 hours after anesthesia.   Special Instructions: Bring a copy of your healthcare power of attorney and living will documents the day of surgery if you haven't scanned them before.              Please read over the following fact sheets you were given: IF YOU HAVE QUESTIONS ABOUT YOUR PRE-OP INSTRUCTIONS PLEASE CALL [name]    If you received a COVID test during your pre-op visit  it is requested that you wear a mask when out in public, stay away from anyone that may not be feeling well and notify your surgeon if you develop symptoms. If you test positive for Covid or have been in contact with anyone that has tested positive in the last 10 days please notify you surgeon.    [place] - Preparing for Surgery Before surgery, you can play an important role.  Because skin is not sterile, your skin needs to be as free of germs as  possible.  You can reduce the number of germs on your skin by washing with CHG (chlorahexidine gluconate) soap before surgery.  CHG is an antiseptic cleaner which kills germs and bonds with the skin to continue killing germs even after washing. Please DO NOT use if you have an allergy to CHG or antibacterial soaps.  If your skin  becomes reddened/irritated stop using the CHG and inform your nurse when you arrive at Short Stay. Do not shave (including legs and underarms) for at least 48 hours prior to the first CHG shower.  You may shave your face/neck.  Please follow these instructions carefully:  1.  Shower with CHG Soap the night before surgery and the  morning of surgery.  2.  If you choose to wash your hair, wash your hair first as usual with your normal  shampoo.  3.  After you shampoo, rinse your hair and body thoroughly to remove the shampoo.                             4.  Use CHG as you would any other liquid soap.  You can apply chg directly to the skin and wash.  Gently with a scrungie or clean washcloth.  5.  Apply the CHG Soap to your body ONLY FROM THE NECK DOWN.   Do   not use on face/ open                           Wound or open sores. Avoid contact with eyes, ears mouth and   genitals (private parts).                       Wash face,  Genitals (private parts) with your normal soap.             6.  Wash thoroughly, paying special attention to the area where your    surgery  will be performed.  7.  Thoroughly rinse your body with warm water from the neck down.  8.  DO NOT shower/wash with your normal soap after using and rinsing off the CHG Soap.                9.  Pat yourself dry with a clean towel.            10.  Wear clean pajamas.            11.  Place clean sheets on your bed the night of your first shower and do not  sleep with pets. Day of Surgery : Do not apply any lotions/deodorants the morning of surgery.  Please wear clean clothes to the hospital/surgery  center.  FAILURE TO FOLLOW THESE INSTRUCTIONS MAY RESULT IN THE CANCELLATION OF YOUR SURGERY  PATIENT SIGNATURE_________________________________  NURSE SIGNATURE__________________________________  ________________________________________________________________________

## 2023-09-06 NOTE — Progress Notes (Signed)
 COVID Vaccine Completed:  Date of COVID positive in last 90 days:  PCP - Guyann Leitz, MD Cardiologist - Alexandria Angel, MD Infectious Disease- Annamaria Barrette, FNP  Cardiac clearance by Lawana Pray, NP 08/05/23 in Epic  Chest x-ray - CT- 06/01/23 Epic EKG - 06/30/23 Epic  Stress Test - 08/04/23 Epic ECHO - 08/04/23 Epic Cardiac Cath - 11/18/16 CEW Pacemaker/ICD device last checked: Spinal Cord Stimulator:  Bowel Prep -   Sleep Study -  CPAP -   Fasting Blood Sugar -  Checks Blood Sugar _____ times a day  Last dose of GLP1 agonist-  Mounjaro  GLP1 instructions:  Hold 7 days before surgery    Last dose of SGLT-2 inhibitors-  N/A SGLT-2 instructions:  Hold 3 days before surgery    Blood Thinner Instructions:  Last dose:   Time: Aspirin Instructions: Last Dose:  Activity level:  Can go up a flight of stairs and perform activities of daily living without stopping and without symptoms of chest pain or shortness of breath.  Able to exercise without symptoms  Unable to go up a flight of stairs without symptoms of     Anesthesia review: HTN, CAD, OSA, DM2, HIV, heart attack, heart murmur  Patient denies shortness of breath, fever, cough and chest pain at PAT appointment  Patient verbalized understanding of instructions that were given to them at the PAT appointment. Patient was also instructed that they will need to review over the PAT instructions again at home before surgery.

## 2023-09-07 ENCOUNTER — Other Ambulatory Visit: Payer: Self-pay

## 2023-09-07 ENCOUNTER — Encounter (HOSPITAL_COMMUNITY): Payer: Self-pay

## 2023-09-07 ENCOUNTER — Encounter (HOSPITAL_COMMUNITY)
Admission: RE | Admit: 2023-09-07 | Discharge: 2023-09-07 | Disposition: A | Discharge status: Home / Self Care | Source: Ambulatory Visit | Attending: General Surgery | Admitting: General Surgery

## 2023-09-07 VITALS — BP 138/61 | HR 68 | Temp 97.7°F | Resp 16 | Ht 72.0 in | Wt 228.0 lb

## 2023-09-07 DIAGNOSIS — Z21 Asymptomatic human immunodeficiency virus [HIV] infection status: Secondary | ICD-10-CM | POA: Diagnosis not present

## 2023-09-07 DIAGNOSIS — Z955 Presence of coronary angioplasty implant and graft: Secondary | ICD-10-CM | POA: Diagnosis not present

## 2023-09-07 DIAGNOSIS — I10 Essential (primary) hypertension: Secondary | ICD-10-CM | POA: Insufficient documentation

## 2023-09-07 DIAGNOSIS — E119 Type 2 diabetes mellitus without complications: Secondary | ICD-10-CM | POA: Insufficient documentation

## 2023-09-07 DIAGNOSIS — R85612 Low grade squamous intraepithelial lesion on cytologic smear of anus (LGSIL): Secondary | ICD-10-CM | POA: Diagnosis not present

## 2023-09-07 DIAGNOSIS — Z01812 Encounter for preprocedural laboratory examination: Secondary | ICD-10-CM | POA: Diagnosis not present

## 2023-09-07 DIAGNOSIS — G4733 Obstructive sleep apnea (adult) (pediatric): Secondary | ICD-10-CM | POA: Insufficient documentation

## 2023-09-07 DIAGNOSIS — I2510 Atherosclerotic heart disease of native coronary artery without angina pectoris: Secondary | ICD-10-CM | POA: Insufficient documentation

## 2023-09-07 DIAGNOSIS — Z7984 Long term (current) use of oral hypoglycemic drugs: Secondary | ICD-10-CM | POA: Insufficient documentation

## 2023-09-07 HISTORY — DX: Malignant (primary) neoplasm, unspecified: C80.1

## 2023-09-07 HISTORY — DX: Unspecified osteoarthritis, unspecified site: M19.90

## 2023-09-07 LAB — CBC
HCT: 41.9 % (ref 39.0–52.0)
Hemoglobin: 14.4 g/dL (ref 13.0–17.0)
MCH: 33.6 pg (ref 26.0–34.0)
MCHC: 34.4 g/dL (ref 30.0–36.0)
MCV: 97.7 fL (ref 80.0–100.0)
Platelets: 186 10*3/uL (ref 150–400)
RBC: 4.29 MIL/uL (ref 4.22–5.81)
RDW: 12.5 % (ref 11.5–15.5)
WBC: 6.4 10*3/uL (ref 4.0–10.5)
nRBC: 0 % (ref 0.0–0.2)

## 2023-09-07 LAB — BASIC METABOLIC PANEL WITH GFR
Anion gap: 9 (ref 5–15)
BUN: 14 mg/dL (ref 8–23)
CO2: 25 mmol/L (ref 22–32)
Calcium: 9.1 mg/dL (ref 8.9–10.3)
Chloride: 105 mmol/L (ref 98–111)
Creatinine, Ser: 1.09 mg/dL (ref 0.61–1.24)
GFR, Estimated: >60 mL/min (ref >60)
Glucose, Bld: 108 mg/dL — ABNORMAL HIGH (ref 70–99)
Potassium: 3.6 mmol/L (ref 3.5–5.1)
Sodium: 139 mmol/L (ref 135–145)

## 2023-09-07 LAB — GLUCOSE, CAPILLARY: Glucose-Capillary: 113 mg/dL — ABNORMAL HIGH (ref 70–99)

## 2023-09-07 NOTE — Progress Notes (Signed)
 Anesthesia Chart Review   Case: [id number] Date/Time: 09/08/23 0945   Procedure: ANOSCOPY, HIGH RESOLUTION - High resolution anoscopy with possible biopsy/ablation   Anesthesia type: Monitor Anesthesia Care   Diagnosis: LGSIL Pap smear of anus [R85.612]   Pre-op diagnosis: ABNORMAL PAP TEST   Location: [facility] ROOM 01 / [facility]   Surgeons: Joyce Nixon, MD       DISCUSSION:62 y.o. smoker with h/o HIV well controlled followed by ID, HTN, OSA, DM II, CAD s/p PCI to RCA 2011, DES to RCA 2018, abnormal pap test scheduled for above procedure 09/08/2023 with Dr. Joyce Nixon.   Case previously postponed due to need for echo and stress test.   Echo 08/04/2023 with EF 60-65%, moderate to severe AS with mean gradient 34.0 mmHg, AVA 0.75 cm2.  This is unchanged from previous Echo in 2023.   Low risk stress test 08/05/2023.   Per cardiology preoperative evaluation 08/05/2023, "Chart reviewed as part of pre-operative protocol coverage. Given past medical history and time since last visit, based on ACC/AHA guidelines, OMARIAN JAQUITH would be at acceptable risk for the planned procedure without further cardiovascular testing.    Had reassuring echocardiogram and stress testing.  He is low risk for upcoming surgery.  He may proceed without further cardiac testing."  VS: BP 138/61   Pulse 68   Temp 36.5 C (Oral)   Resp 16   Ht 6' (1.829 m)   Wt 103.4 kg   SpO2 96%   BMI 30.92 kg/m   PROVIDERS: Georgean Kindle, MD is PCP   Cardiologist - Alexandria Angel, MD  Infectious Disease- Annamaria Barrette, FNP LABS: Labs reviewed: Acceptable for surgery. (all labs ordered are listed, but only abnormal results are displayed)  Labs Reviewed  GLUCOSE, CAPILLARY - Abnormal; Notable for the following components:      Result Value   Glucose-Capillary 113 (*)    All other components within normal limits  CBC  HEMOGLOBIN A1C  BASIC METABOLIC PANEL WITH GFR     IMAGES:   EKG:   CV: Myocardial Perfusion  08/04/2023   The study is normal. The study is low risk.   No ST deviation was noted.   LV perfusion is normal. There is no evidence of ischemia. There is no evidence of infarction.   LVEF could not be accurately reported. Refer to recent echocardiogram.   CT images were obtained for attenuation correction and were examined for the presence of coronary calcium when appropriate.   Coronary calcium assessment not performed due to prior revascularization. Severe aortic valve calcifications.   Prior study not available for comparison.  Echo 08/04/2023  1. Left ventricular ejection fraction, by estimation, is 60 to 65%. The  left ventricle has normal function. The left ventricle has no regional  wall motion abnormalities. There is mild concentric left ventricular  hypertrophy. Left ventricular diastolic  parameters were normal. The average left ventricular global longitudinal  strain is -18.4 %. The global longitudinal strain is normal.   2. Right ventricular systolic function is normal. The right ventricular  size is normal. There is normal pulmonary artery systolic pressure.   3. Left atrial size was mild to moderately dilated.   4. The mitral valve is normal in structure. No evidence of mitral valve  regurgitation.   5. Bicuspid valve is suspected. Suboptimal LVOT Doppler precludes acurate  DVI, AVA, and LV Stroke volume index assessment. The aortic valve was not  well visualized. Aortic valve regurgitation is mild. Moderate to  severe  aortic valve stenosis. Aortic  valve area, by VTI measures 0.75 cm. Aortic valve mean gradient measures  34.0 mmHg. Aortic valve Vmax measures 3.65 m/s. Aortic valve acceleration  time measures 106 msec.   6. The inferior vena cava is normal in size with greater than 50%  respiratory variability, suggesting right atrial pressure of 3 mmHg.  Past Medical History:  Diagnosis Date   Arthritis    Asthma    Cancer (HCC)    melenoma   Coronary artery disease     DM type 2 (diabetes mellitus, type 2) (HCC)    Heart attack (HCC) 11/2009   2 stents   Heart murmur    HIV infection (HCC)    undetectable   HTN (hypertension)    Hypercholesterolemia    Hypothyroid    Neuromuscular disorder (HCC)    neuropathy fingers   OSA (obstructive sleep apnea)    Pneumonia    Seasonal allergies     Past Surgical History:  Procedure Laterality Date   APPENDECTOMY     BACK SURGERY     CARPAL TUNNEL RELEASE Left    KNEE SURGERY     left   right shoulder     right shoulder spurs   ROTATOR CUFF REPAIR     right   UMBILICAL HERNIA REPAIR     x2    MEDICATIONS:  bictegravir-emtricitabine-tenofovir AF (BIKTARVY ) 50-200-25 MG TABS tablet   levothyroxine  (SYNTHROID ) 200 MCG tablet   lisinopril  (ZESTRIL ) 20 MG tablet   metFORMIN  (GLUCOPHAGE ) 1000 MG tablet   NON FORMULARY   tirzepatide (MOUNJARO) 15 MG/0.5ML Pen   No current facility-administered medications for this encounter.   Chick Cotton Ward, PA-C [facility] [phone]

## 2023-09-07 NOTE — Anesthesia Preprocedure Evaluation (Signed)
 Anesthesia Evaluation  Patient identified by MRN, date of birth, ID band Patient awake    Reviewed: Allergy & Precautions, NPO status , Patient's Chart, lab work & pertinent test results  Airway Mallampati: II  TM Distance: >3 FB Neck ROM: Full    Dental no notable dental hx.    Pulmonary asthma , sleep apnea , Current Smoker   Pulmonary exam normal        Cardiovascular hypertension, Pt. on medications + CAD, + Past MI and + Cardiac Stents   Rhythm:Regular Rate:Normal + Systolic murmurs Echo 08/04/2023  1. Left ventricular ejection fraction, by estimation, is 60 to 65%. The  left ventricle has normal function. The left ventricle has no regional  wall motion abnormalities. There is mild concentric left ventricular  hypertrophy. Left ventricular diastolic  parameters were normal. The average left ventricular global longitudinal  strain is -18.4 %. The global longitudinal strain is normal.   2. Right ventricular systolic function is normal. The right ventricular  size is normal. There is normal pulmonary artery systolic pressure.   3. Left atrial size was mild to moderately dilated.   4. The mitral valve is normal in structure. No evidence of mitral valve  regurgitation.   5. Bicuspid valve is suspected. Suboptimal LVOT Doppler precludes acurate  DVI, AVA, and LV Stroke volume index assessment. The aortic valve was not  well visualized. Aortic valve regurgitation is mild. Moderate to severe  aortic valve stenosis. Aortic  valve area, by VTI measures 0.75 cm. Aortic valve mean gradient measures  34.0 mmHg. Aortic valve Vmax measures 3.65 m/s. Aortic valve acceleration  time measures 106 msec.   6. The inferior vena cava is normal in size with greater than 50%  respiratory variability, suggesting right atrial pressure of 3 mmHg.     Neuro/Psych negative neurological ROS  negative psych ROS   GI/Hepatic Neg liver ROS,,,Anal  mass   Endo/Other  diabetes, Type 2, Oral Hypoglycemic AgentsHypothyroidism    Renal/GU negative Renal ROS  negative genitourinary   Musculoskeletal  (+) Arthritis , Osteoarthritis,    Abdominal Normal abdominal exam  (+)   Peds  Hematology  (+) HIVLab Results      Component                Value               Date                      WBC                      6.4                 09/07/2023                HGB                      14.4                09/07/2023                HCT                      41.9                09/07/2023                MCV  97.7                09/07/2023                PLT                      186                 09/07/2023              Anesthesia Other Findings   Reproductive/Obstetrics                             Anesthesia Physical Anesthesia Plan  ASA: 3  Anesthesia Plan: MAC   Post-op Pain Management: Tylenol  PO (pre-op)*   Induction: Intravenous  PONV Risk Score and Plan: Ondansetron, Dexamethasone, Midazolam and Treatment may vary due to age or medical condition  Airway Management Planned: Simple Face Mask and Nasal Cannula  Additional Equipment: None  Intra-op Plan:   Post-operative Plan:   Informed Consent: I have reviewed the patients History and Physical, chart, labs and discussed the procedure including the risks, benefits and alternatives for the proposed anesthesia with the patient or authorized representative who has indicated his/her understanding and acceptance.     Dental advisory given  Plan Discussed with: CRNA  Anesthesia Plan Comments: (See PAT note 09/07/2023)       Anesthesia Quick Evaluation

## 2023-09-08 ENCOUNTER — Encounter (HOSPITAL_COMMUNITY): Payer: Self-pay | Admitting: General Surgery

## 2023-09-08 ENCOUNTER — Other Ambulatory Visit: Payer: Self-pay

## 2023-09-08 ENCOUNTER — Encounter (HOSPITAL_COMMUNITY)
Admission: RE | Disposition: A | Discharge status: Home / Self Care | Payer: Self-pay | Source: Home / Self Care | Attending: General Surgery

## 2023-09-08 ENCOUNTER — Ambulatory Visit (HOSPITAL_COMMUNITY): Admitting: Anesthesiology

## 2023-09-08 ENCOUNTER — Ambulatory Visit (HOSPITAL_COMMUNITY)
Admission: RE | Admit: 2023-09-08 | Discharge: 2023-09-08 | Disposition: A | Discharge status: Home / Self Care | Attending: General Surgery | Admitting: General Surgery

## 2023-09-08 ENCOUNTER — Ambulatory Visit (HOSPITAL_COMMUNITY): Admitting: Physician Assistant

## 2023-09-08 DIAGNOSIS — F17210 Nicotine dependence, cigarettes, uncomplicated: Secondary | ICD-10-CM

## 2023-09-08 DIAGNOSIS — I2510 Atherosclerotic heart disease of native coronary artery without angina pectoris: Secondary | ICD-10-CM | POA: Diagnosis not present

## 2023-09-08 DIAGNOSIS — E119 Type 2 diabetes mellitus without complications: Secondary | ICD-10-CM

## 2023-09-08 DIAGNOSIS — R85612 Low grade squamous intraepithelial lesion on cytologic smear of anus (LGSIL): Secondary | ICD-10-CM

## 2023-09-08 DIAGNOSIS — I10 Essential (primary) hypertension: Secondary | ICD-10-CM

## 2023-09-08 DIAGNOSIS — E66812 Obesity, class 2: Secondary | ICD-10-CM | POA: Insufficient documentation

## 2023-09-08 DIAGNOSIS — K6282 Dysplasia of anus: Secondary | ICD-10-CM | POA: Insufficient documentation

## 2023-09-08 DIAGNOSIS — Z6838 Body mass index (BMI) 38.0-38.9, adult: Secondary | ICD-10-CM | POA: Insufficient documentation

## 2023-09-08 HISTORY — PX: HIGH RESOLUTION ANOSCOPY: SHX6345

## 2023-09-08 LAB — GLUCOSE, CAPILLARY
Glucose-Capillary: 92 mg/dL (ref 70–99)
Glucose-Capillary: 119 mg/dL — ABNORMAL HIGH (ref 70–99)

## 2023-09-08 LAB — HEMOGLOBIN A1C
Hgb A1c MFr Bld: 5.1 % (ref 4.8–5.6)
Mean Plasma Glucose: 100 mg/dL

## 2023-09-08 SURGERY — ANOSCOPY, HIGH RESOLUTION
Anesthesia: Monitor Anesthesia Care

## 2023-09-08 MED ORDER — LIDOCAINE HCL (PF) 2 % IJ SOLN
INTRAMUSCULAR | Status: AC
  Filled 2023-09-08: qty 5

## 2023-09-08 MED ORDER — ACETIC ACID 5 % SOLN
Status: DC | PRN
Start: 2023-09-08 — End: 2023-09-08
  Administered 2023-09-08: 1 via TOPICAL

## 2023-09-08 MED ORDER — GLYCOPYRROLATE 0.2 MG/ML IJ SOLN
INTRAMUSCULAR | Status: AC
  Filled 2023-09-08: qty 1

## 2023-09-08 MED ORDER — PROPOFOL 10 MG/ML IV BOLUS
INTRAVENOUS | Status: DC | PRN
  Administered 2023-09-08: 50 mg via INTRAVENOUS
  Administered 2023-09-08: 120 ug/kg/min via INTRAVENOUS

## 2023-09-08 MED ORDER — CHLORHEXIDINE GLUCONATE 0.12 % MT SOLN
15.0000 mL | Freq: Once | OROMUCOSAL | Status: AC
Start: 2023-09-08 — End: 2023-09-08
  Administered 2023-09-08: 15 mL via OROMUCOSAL

## 2023-09-08 MED ORDER — ORAL CARE MOUTH RINSE: 15.0000 mL | Freq: Once | OROMUCOSAL | Status: AC

## 2023-09-08 MED ORDER — PROPOFOL 1000 MG/100ML IV EMUL
INTRAVENOUS | Status: AC
  Filled 2023-09-08: qty 100

## 2023-09-08 MED ORDER — BUPIVACAINE-EPINEPHRINE (PF) 0.5% -1:200000 IJ SOLN
INTRAMUSCULAR | Status: AC
  Filled 2023-09-08: qty 30

## 2023-09-08 MED ORDER — INSULIN ASPART 100 UNIT/ML IJ SOLN: 0.0000 [IU] | INTRAMUSCULAR | Status: DC | PRN

## 2023-09-08 MED ORDER — MIDAZOLAM HCL 2 MG/2ML IJ SOLN
INTRAMUSCULAR | Status: DC | PRN
  Administered 2023-09-08: 2 mg via INTRAVENOUS

## 2023-09-08 MED ORDER — DEXAMETHASONE SODIUM PHOSPHATE 10 MG/ML IJ SOLN
INTRAMUSCULAR | Status: AC
Start: 2023-09-08 — End: 2023-09-08
  Filled 2023-09-08: qty 1

## 2023-09-08 MED ORDER — LIDOCAINE HCL (CARDIAC) PF 100 MG/5ML IV SOSY
PREFILLED_SYRINGE | INTRAVENOUS | Status: DC | PRN
  Administered 2023-09-08: 100 mg via INTRATRACHEAL

## 2023-09-08 MED ORDER — FENTANYL CITRATE (PF) 100 MCG/2ML IJ SOLN
INTRAMUSCULAR | Status: AC
  Filled 2023-09-08: qty 2

## 2023-09-08 MED ORDER — LACTATED RINGERS IV SOLN
INTRAVENOUS | Status: DC | PRN
Start: 2023-09-08 — End: 2023-09-08

## 2023-09-08 MED ORDER — SODIUM CHLORIDE 0.9% FLUSH: 3.0000 mL | Freq: Two times a day (BID) | INTRAVENOUS | Status: DC

## 2023-09-08 MED ORDER — KETAMINE HCL 50 MG/5ML IJ SOSY
PREFILLED_SYRINGE | INTRAMUSCULAR | Status: AC
  Filled 2023-09-08: qty 5

## 2023-09-08 MED ORDER — BUPIVACAINE-EPINEPHRINE 0.5% -1:200000 IJ SOLN
INTRAMUSCULAR | Status: DC | PRN
  Administered 2023-09-08: 30 mL

## 2023-09-08 MED ORDER — FENTANYL CITRATE PF 50 MCG/ML IJ SOSY: 25.0000 ug | PREFILLED_SYRINGE | INTRAMUSCULAR | Status: DC | PRN

## 2023-09-08 MED ORDER — ACETIC ACID 5 % SOLN
Status: AC
  Filled 2023-09-08: qty 12

## 2023-09-08 MED ORDER — ONDANSETRON HCL 4 MG/2ML IJ SOLN
INTRAMUSCULAR | Status: DC | PRN
  Administered 2023-09-08: 4 mg via INTRAVENOUS

## 2023-09-08 MED ORDER — TRAMADOL HCL 50 MG PO TABS: 50.0000 mg | ORAL_TABLET | Freq: Four times a day (QID) | ORAL | 0 refills | Status: AC | PRN

## 2023-09-08 MED ORDER — FENTANYL CITRATE (PF) 100 MCG/2ML IJ SOLN
INTRAMUSCULAR | Status: DC | PRN
  Administered 2023-09-08: 100 ug via INTRAVENOUS

## 2023-09-08 MED ORDER — DEXAMETHASONE SODIUM PHOSPHATE 10 MG/ML IJ SOLN
INTRAMUSCULAR | Status: DC | PRN
  Administered 2023-09-08: 8 mg via INTRAVENOUS

## 2023-09-08 MED ORDER — MIDAZOLAM HCL 2 MG/2ML IJ SOLN
INTRAMUSCULAR | Status: AC
Start: 2023-09-08 — End: 2023-09-08
  Filled 2023-09-08: qty 2

## 2023-09-08 MED ORDER — ACETIC ACID 5 % SOLN
Status: AC
  Filled 2023-09-08: qty 24

## 2023-09-08 MED ORDER — ONDANSETRON HCL 4 MG/2ML IJ SOLN
INTRAMUSCULAR | Status: AC
  Filled 2023-09-08: qty 2

## 2023-09-08 MED ORDER — ACETAMINOPHEN 500 MG PO TABS
1000.0000 mg | ORAL_TABLET | ORAL | Status: AC
  Administered 2023-09-08: 1000 mg via ORAL
  Filled 2023-09-08: qty 2

## 2023-09-08 MED ORDER — LACTATED RINGERS IV SOLN
INTRAVENOUS | Status: DC
Start: 2023-09-08 — End: 2023-09-08

## 2023-09-08 MED ORDER — PHENYLEPHRINE 80 MCG/ML (10ML) SYRINGE FOR IV PUSH (FOR BLOOD PRESSURE SUPPORT)
PREFILLED_SYRINGE | INTRAVENOUS | Status: DC | PRN
  Administered 2023-09-08 (×2): 80 ug via INTRAVENOUS

## 2023-09-08 SURGICAL SUPPLY — 34 items
APPLICATOR COTTON TIP 6 STRL (MISCELLANEOUS) IMPLANT
APPLICATOR COTTON TIP 6IN STRL (MISCELLANEOUS) IMPLANT
BENZOIN TINCTURE PRP APPL 2/3 (GAUZE/BANDAGES/DRESSINGS) ×2 IMPLANT
BLADE EXTENDED COATED 6.5IN (ELECTRODE) IMPLANT
BLADE SURG SZ10 CARB STEEL (BLADE) ×2 IMPLANT
BRIEF MESH DISP LRG (UNDERPADS AND DIAPERS) ×2 IMPLANT
COVER BACK TABLE 60X90IN (DRAPES) ×2 IMPLANT
DRAPE LAPAROTOMY T 98X78 PEDS (DRAPES) ×2 IMPLANT
DRAPE UTILITY XL STRL (DRAPES) ×2 IMPLANT
ELECTRODE REM PT RTRN 9FT ADLT (ELECTROSURGICAL) ×2 IMPLANT
GAUZE 4X4 16PLY ~~LOC~~+RFID DBL (SPONGE) ×2 IMPLANT
GAUZE PAD ABD 8X10 STRL (GAUZE/BANDAGES/DRESSINGS) IMPLANT
GAUZE SPONGE 4X4 12PLY STRL (GAUZE/BANDAGES/DRESSINGS) IMPLANT
GLOVE BIO SURGEON STRL SZ 6.5 (GLOVE) ×2 IMPLANT
GLOVE INDICATOR 6.5 STRL GRN (GLOVE) ×2 IMPLANT
JELLY LUBE HR FLIP 4OZ (MISCELLANEOUS) IMPLANT
KIT BASIN OR (CUSTOM PROCEDURE TRAY) ×2 IMPLANT
KIT SIGMOIDOSCOPE (SET/KITS/TRAYS/PACK) IMPLANT
KIT TURNOVER CYSTO (KITS) ×2 IMPLANT
NDL HYPO 22X1.5 SAFETY MO (MISCELLANEOUS) ×2 IMPLANT
NEEDLE HYPO 22X1.5 SAFETY MO (MISCELLANEOUS) ×1 IMPLANT
NS IRRIG 500ML POUR BTL (IV SOLUTION) ×2 IMPLANT
PACK LITHOTOMY IV (CUSTOM PROCEDURE TRAY) IMPLANT
PAD ARMBOARD POSITIONER FOAM (MISCELLANEOUS) IMPLANT
PENCIL SMOKE EVACUATOR (MISCELLANEOUS) ×2 IMPLANT
SPIKE FLUID TRANSFER (MISCELLANEOUS) ×2 IMPLANT
SPONGE SURGIFOAM ABS GEL 12-7 (HEMOSTASIS) IMPLANT
SUT CHROMIC 2 0 SH (SUTURE) IMPLANT
SUT CHROMIC 3 0 SH 27 (SUTURE) IMPLANT
SYR BULB IRRIG 60ML STRL (SYRINGE) ×2 IMPLANT
SYR CONTROL 10ML LL (SYRINGE) ×2 IMPLANT
TOWEL OR 17X24 6PK STRL BLUE (TOWEL DISPOSABLE) ×2 IMPLANT
TUBING CONNECTING 10 (TUBING) ×2 IMPLANT
YANKAUER SUCT BULB TIP NO VENT (SUCTIONS) IMPLANT

## 2023-09-08 NOTE — H&P (Signed)
 REFERRING PHYSICIAN:  Annamaria Barrette, NP   PROVIDER:  Denese Finn, MD   MRN: [id number] DOB: 1960/06/21 DATE OF ENCOUNTER: 07/12/2023   Subjective    Chief Complaint: New Consultation       History of Present Illness: Ethan Farrell is a 63 y.o. male who is seen today as an office consultation at the request of PA Calone for evaluation of New Consultation .  63 year old male with HIV who presents to the office for abnormal Pap test.  This was performed 05/20/2023 and showed LSIL.     Review of Systems: A complete review of systems was obtained from the patient.  I have reviewed this information and discussed as appropriate with the patient.  See HPI as well for other ROS.       Medical History: Past Medical History  No past medical history on file.      Problem List       Patient Active Problem List  Diagnosis   Arteriosclerosis of coronary artery   Bilateral pulmonary infiltrates on chest x-ray   Cigarette smoker   Class 2 obesity due to excess calories with body mass index (BMI) of 38.0 to 38.9 in adult   Diabetic peripheral neuropathy (CMS/HHS-HCC)   Essential hypertension   HSV infection   Hypothyroidism   Other specified hypothyroidism   Syphilis   HIV -NO current or prior AIDS criteria or opportunistic infection, Asymptomatic, active antiretroviral treatment  (CMS/HHS-HCC)        Past Surgical History  No past surgical history on file.      Allergies           Allergies  Allergen Reactions   Prednisone Other (See Comments)      Itching, flushing   Sulfa (Sulfonamide Antibiotics) Itching        Medications Ordered Prior to Encounter             Current Outpatient Medications on File Prior to Visit  Medication Sig Dispense Refill   BIKTARVY  50-200-25 mg tablet Take 1 tablet by mouth once daily       levothyroxine  (SYNTHROID ) 175 MCG tablet Take 1 tablet by mouth every morning before breakfast (0630)       lisinopriL  (ZESTRIL ) 10 MG  tablet Take 10 mg by mouth once daily       metFORMIN  (GLUCOPHAGE ) 1000 MG tablet TAKE 1 TABLET BY MOUTH EVERY DAY WITH A MEAL        No current facility-administered medications on file prior to visit.        Family History  No family history on file.      Tobacco Use History  Social History         Tobacco Use  Smoking Status Not on file  Smokeless Tobacco Not on file        Social History  Social History           Socioeconomic History   Marital status: Widowed    Social Drivers of Health           Housing Stability: Unknown (07/12/2023)    Housing Stability Vital Sign     Homeless in the Last Year: No        Objective:      Vitals:   09/08/23 0755  BP: (!) 153/75  Pulse: 64  Resp: 16  Temp: 97.8 F (36.6 C)  SpO2: 96%    Exam Gen: NAD CV: RRR Pulm: CTA Abd: soft  Labs, Imaging and Diagnostic Testing: CD4 670 HIV ND   Assessment and Plan:  Diagnoses and all orders for this visit:   LGSIL Pap smear of anus     I recommend proceeding with high-resolution anoscopy with possible biopsy or ablation.  We discussed this in detail today including risk of postoperative pain bleeding and recurrence.  All questions were answered.   Fernande Howells, MD Colon and Rectal Surgery [facility]

## 2023-09-08 NOTE — Anesthesia Procedure Notes (Signed)
 Procedure Name: MAC Date/Time: 09/08/2023 9:41 AM  Performed by: Darlena Ego, CRNAPre-anesthesia Checklist: Emergency Drugs available, Patient identified, Suction available, Patient being monitored and Timeout performed Patient Re-evaluated:Patient Re-evaluated prior to induction Oxygen Delivery Method: Simple face mask Preoxygenation: Pre-oxygenation with 100% oxygen Induction Type: IV induction Airway Equipment and Method: Oral airway

## 2023-09-08 NOTE — Transfer of Care (Signed)
 Immediate Anesthesia Transfer of Care Note  Patient: Ethan Farrell  Procedure(s) Performed: ANOSCOPY, HIGH RESOLUTION WITH BIOPSY ABLATION  Patient Location: PACU  Anesthesia Type:MAC  Level of Consciousness: sedated and responds to stimulation  Airway & Oxygen Therapy: Patient Spontanous Breathing and Patient connected to nasal cannula oxygen  Post-op Assessment: Report given to RN and Post -op Vital signs reviewed and stable  Post vital signs: Reviewed and stable  Last Vitals:  Vitals Value Taken Time  BP    Temp    Pulse    Resp    SpO2      Last Pain:  Vitals:   09/08/23 0800  TempSrc:   PainSc: 0-No pain         Complications: No notable events documented.

## 2023-09-08 NOTE — Discharge Instructions (Addendum)

## 2023-09-08 NOTE — Anesthesia Postprocedure Evaluation (Signed)
 Anesthesia Post Note  Patient: Ethan Farrell  Procedure(s) Performed: ANOSCOPY, HIGH RESOLUTION WITH BIOPSY ABLATION     Patient location during evaluation: PACU Anesthesia Type: MAC Level of consciousness: awake and alert Pain management: pain level controlled Vital Signs Assessment: post-procedure vital signs reviewed and stable Respiratory status: spontaneous breathing, nonlabored ventilation, respiratory function stable and patient connected to nasal cannula oxygen Cardiovascular status: stable and blood pressure returned to baseline Postop Assessment: no apparent nausea or vomiting Anesthetic complications: no   No notable events documented.  Last Vitals:  Vitals:   09/08/23 1100 09/08/23 1113  BP: 137/70 (!) 147/72  Pulse: 67 63  Resp: 18 16  Temp: (!) 36.4 C   SpO2: 91% 94%    Last Pain:  Vitals:   09/08/23 1113  TempSrc:   PainSc: 2                  Theotis Flake P Letrice Pollok

## 2023-09-08 NOTE — Op Note (Signed)
 09/08/2023  10:16 AM  PATIENT:  Ethan Farrell  63 y.o. male  Patient Care Team: Georgean Kindle, MD as PCP - General (Internal Medicine) Audery Blazing Deannie Fabian, MD as PCP - Cardiology (Cardiology)  PRE-OPERATIVE DIAGNOSIS:  ABNORMAL PAP TEST  POST-OPERATIVE DIAGNOSIS:  ABNORMAL PAP TEST  PROCEDURE:  ANOSCOPY, HIGH RESOLUTION WITH BIOPSY AND ABLATION    Surgeon(s): Joyce Nixon, MD  ASSISTANT: none   ANESTHESIA:   local and MAC  EBL: 5ml  SPECIMEN:  Source of Specimen:  biopsy anterior midline and posterior midline anal canal  DISPOSITION OF SPECIMEN:  PATHOLOGY  COUNTS:  YES  PLAN OF CARE: Discharge to home after PACU  PATIENT DISPOSITION:  PACU - hemodynamically stable.  INDICATION: 63 y.o. M with LGSIL seen on anal Pap test  OR FINDINGS: Anterior and posterior midline lesions  DESCRIPTION: The patient was identified in the preoperative holding area and taken to the OR where they were laid supine on the operating room table in lithotomy position. MAC anesthesia was smoothly induced.  The patient was then prepped and draped in the usual sterile fashion. A surgical timeout was performed indicating the correct patient, procedure, positioning and preoperative antibioitics. SCDs were noted to be in place and functioning prior to the operation.   After this was completed, a sponge was soaked in 5% acetic acid was placed over the perianal region. This was allowed to soak for 2 minutes. The sponge was removed and the perianal region was evaluated with a colposcope.  There were no external lesions present.  The internal anal canal was evaluated via anoscopy with a Hill-Ferguson anoscope.  There was a small discrete lesion just proximal to the dentate line at anterior midline, which was biopsied.  There was another larger, linear lesion at posterior midline at the dentate line.  This was also biopsied.  Both areas were then ablated using electrocautery. After this was completed, all of  the biopsy sites were closed using a 2-0 chromic suture.  A sterile dressing was applied over this. The patient was then awakened from anesthesia and sent to the postanesthesia care unit in stable condition. All counts were correct operating room staff.   Fernande Howells, MD  Colorectal and General Surgery [facility] [place] [facility]

## 2023-09-09 ENCOUNTER — Encounter (HOSPITAL_COMMUNITY): Payer: Self-pay | Admitting: General Surgery

## 2023-09-09 LAB — SURGICAL PATHOLOGY

## 2023-10-05 NOTE — Progress Notes (Signed)
 HPI: FU CAD.  Patient had PCI of the right coronary artery in 2011. Cardiac catheterization was performed August 2018 and patient was found to have severe stenosis in the proximal right coronary artery (80%), patent stents in the mid right coronary artery and otherwise nonobstructive coronary disease; ejection fraction 55%. Patient had PCI of the right coronary artery with a drug-eluting stent.  Carotid Dopplers September 2023 showed no hemodynamically significant stenosis.  Seen in the office April 2025 with episodes of dizziness.  Echocardiogram May 2025 showed normal LV function, mild left ventricular hypertrophy, mild to moderate left atrial enlargement, possible bicuspid aortic valve with mild aortic insufficiency and moderate to severe aortic stenosis (mean gradient 34 mmHg, aortic valve area 0.75 cm).  Nuclear study May 2025 showed no ischemia or infarction.  Since last seen, patient denies dyspnea on exertion, orthopnea, PND, pedal edema, exertional chest pain or syncope.  Current Outpatient Medications  Medication Sig Dispense Refill   bictegravir-emtricitabine-tenofovir AF (BIKTARVY ) 50-200-25 MG TABS tablet Take 1 tablet by mouth daily. 30 tablet 6   levothyroxine  (SYNTHROID ) 200 MCG tablet Take 200 mcg by mouth daily before breakfast.     lisinopril  (ZESTRIL ) 20 MG tablet TAKE 1 TABLET(20 MG) BY MOUTH DAILY (Patient taking differently: Take 20 mg by mouth daily.) 90 tablet 3   metFORMIN  (GLUCOPHAGE ) 1000 MG tablet Take 1,000 mg by mouth daily.     NON FORMULARY Pt uses a c-pap nightly     tirzepatide (MOUNJARO) 15 MG/0.5ML Pen Inject 15 mg into the skin once a week.     traMADol  (ULTRAM ) 50 MG tablet Take 1-2 tablets (50-100 mg total) by mouth every 6 (six) hours as needed. 20 tablet 0   No current facility-administered medications for this visit.     Past Medical History:  Diagnosis Date   Arthritis    Asthma    Cancer (HCC)    melenoma   Coronary artery disease    DM  type 2 (diabetes mellitus, type 2) (HCC)    Heart attack (HCC) 11/2009   2 stents   Heart murmur    HIV infection (HCC)    undetectable   HTN (hypertension)    Hypercholesterolemia    Hypothyroid    Neuromuscular disorder (HCC)    neuropathy fingers   OSA (obstructive sleep apnea)    Pneumonia    Seasonal allergies     Past Surgical History:  Procedure Laterality Date   APPENDECTOMY     BACK SURGERY     CARPAL TUNNEL RELEASE Left    HIGH RESOLUTION ANOSCOPY N/A 09/08/2023   Procedure: ANOSCOPY, HIGH RESOLUTION WITH BIOPSY ABLATION;  Surgeon: Debby Hila, MD;  Location: [facility];  Service: General;  Laterality: N/A;  High resolution anoscopy with possible biopsy/ablation   KNEE SURGERY     left   right shoulder     right shoulder spurs   ROTATOR CUFF REPAIR     right   UMBILICAL HERNIA REPAIR     x2    Social History   Socioeconomic History   Marital status: Widowed    Spouse name: Not on file   Number of children: 2   Years of education: 14   Highest education level: Not on file  Occupational History   Occupation: Production designer, theatre/television/film at [employer]  Tobacco Use   Smoking status: Every Day    Current packs/day: 1.00    Average packs/day: 1 pack/day for 25.0 years (25.0 ttl pk-yrs)    Types:  Cigarettes   Smokeless tobacco: Never   Tobacco comments:    cut back from 2 packs to half pack/day  Vaping Use   Vaping status: Never Used  Substance and Sexual Activity   Alcohol use: No   Drug use: No   Sexual activity: Yes    Partners: Male    Comment: declined condoms  Other Topics Concern   Not on file  Social History Narrative   Not on file   Social Drivers of Health   Financial Resource Strain: Not on file  Food Insecurity: Not on file  Transportation Needs: Not on file  Physical Activity: Not on file  Stress: Not on file  Social Connections: Not on file  Intimate Partner Violence: Not on file    Family History  Problem Relation Age of Onset    Hypertension Father    Heart attack Paternal Grandfather    Heart failure Paternal Grandfather    Heart failure Paternal Grandmother    Heart attack Paternal Grandmother     ROS: no fevers or chills, productive cough, hemoptysis, dysphasia, odynophagia, melena, hematochezia, dysuria, hematuria, rash, seizure activity, orthopnea, PND, pedal edema, claudication. Remaining systems are negative.  Physical Exam: Well-developed well-nourished in no acute distress.  Skin is warm and dry.  HEENT is normal.  Neck is supple.  Chest is clear to auscultation with normal expansion.  Cardiovascular exam is regular rate and rhythm.  3/6 systolic murmur left sternal border. Abdominal exam nontender or distended. No masses palpated. Extremities show no edema. neuro grossly intact   A/P  1 coronary artery disease-patient denies chest pain.  Recent nuclear study showed no ischemia.  Plan medical therapy with aspirin and statin.  2 aortic stenosis-moderate to severe on recent echocardiogram.  I discussed the symptoms to be aware of including worsening dyspnea on exertion, chest pain or syncope.  He has had none of these.  He understands he will likely require aortic valve replacement in the future.  Plan follow-up echocardiogram May 2026 or sooner if necessary.  I will see him back in 6 months to make sure that his symptoms are stable.  3 hypertension-blood pressure controlled.  Continue present medical regimen.  4 hyperlipidemia-continue statin.  Will obtain most recent lipids and liver from primary care.  5 obstructive sleep apnea-continue CPAP.  Redell Shallow, MD

## 2023-10-18 ENCOUNTER — Ambulatory Visit: Attending: Cardiology | Admitting: Cardiology

## 2023-10-18 ENCOUNTER — Encounter: Payer: Self-pay | Admitting: Cardiology

## 2023-10-18 VITALS — BP 130/72 | HR 70 | Ht 73.0 in | Wt 238.2 lb

## 2023-10-18 DIAGNOSIS — I350 Nonrheumatic aortic (valve) stenosis: Secondary | ICD-10-CM | POA: Diagnosis not present

## 2023-10-18 DIAGNOSIS — E782 Mixed hyperlipidemia: Secondary | ICD-10-CM

## 2023-10-18 DIAGNOSIS — I10 Essential (primary) hypertension: Secondary | ICD-10-CM | POA: Diagnosis not present

## 2023-10-18 DIAGNOSIS — I2510 Atherosclerotic heart disease of native coronary artery without angina pectoris: Secondary | ICD-10-CM

## 2023-10-18 MED ORDER — LISINOPRIL 20 MG PO TABS: ORAL_TABLET | ORAL | 3 refills | Status: DC

## 2023-10-18 NOTE — Patient Instructions (Signed)

## 2023-11-11 ENCOUNTER — Ambulatory Visit: Payer: Commercial Managed Care - PPO | Admitting: Family

## 2024-01-17 ENCOUNTER — Other Ambulatory Visit: Payer: Self-pay | Admitting: Family

## 2024-01-17 DIAGNOSIS — B20 Human immunodeficiency virus [HIV] disease: Secondary | ICD-10-CM

## 2024-01-17 NOTE — Telephone Encounter (Signed)
 Can someone get him scheduled for a follow up please

## 2024-01-24 ENCOUNTER — Ambulatory Visit: Admitting: Family

## 2024-01-24 ENCOUNTER — Other Ambulatory Visit: Payer: Self-pay

## 2024-01-24 ENCOUNTER — Encounter: Payer: Self-pay | Admitting: Family

## 2024-01-24 VITALS — BP 150/78 | HR 64 | Temp 98.5°F | Wt 234.0 lb

## 2024-01-24 DIAGNOSIS — Z79899 Other long term (current) drug therapy: Secondary | ICD-10-CM

## 2024-01-24 DIAGNOSIS — Z0000 Encounter for general adult medical examination without abnormal findings: Secondary | ICD-10-CM | POA: Diagnosis not present

## 2024-01-24 DIAGNOSIS — B20 Human immunodeficiency virus [HIV] disease: Secondary | ICD-10-CM

## 2024-01-24 DIAGNOSIS — F17210 Nicotine dependence, cigarettes, uncomplicated: Secondary | ICD-10-CM

## 2024-01-24 DIAGNOSIS — A539 Syphilis, unspecified: Secondary | ICD-10-CM

## 2024-01-24 MED ORDER — BIKTARVY 50-200-25 MG PO TABS: 1.0000 | ORAL_TABLET | Freq: Every day | ORAL | 6 refills | Status: AC

## 2024-01-24 NOTE — Assessment & Plan Note (Signed)
 Ethan Farrell continues to have well-controlled virus with good adherence and tolerance to Biktarvy .  Reviewed previous lab work and discussed plan of care and U equals U.  No problems obtaining medication from the pharmacy and covered by [employer].  Social determinants of health reviewed with no interventions indicated.  Check blood work.  Continue current dose of Biktarvy .  Plan for follow-up in 6 months or sooner if needed with lab work on the same day.
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
## 2024-01-24 NOTE — Patient Instructions (Addendum)
 Nice to see you.  We will check your lab work today.  Continue to take your medication daily as prescribed.  Refills have been sent to the pharmacy.  Plan for follow up in 6 months or sooner if needed with lab work on the same day.  Have a great day and stay safe!   Smoking Cessation: QuitlineNC 1-800-QUIT-NOW (248)712-7172); Espaol: 1-855-Djelo-Ya (1-954-718-9200) http://carroll-castaneda.info/

## 2024-01-24 NOTE — Assessment & Plan Note (Signed)
 Continues to smoke approximately 1 pack per day. Counseled on the dangers of tobacco not ready to quit at this time.  Reviewed strategies to maximize success, including removing cigarettes and smoking materials from environment, stress management, substitution of other forms of reinforcement, support of family/friends, and written materials.

## 2024-01-24 NOTE — Assessment & Plan Note (Signed)
 Most recent RPR titer non-reactive with history of syphilis will monitor RPR periodically.

## 2024-01-24 NOTE — Assessment & Plan Note (Signed)
 Discussed importance of safe sexual practice and condom use. Condoms and site specific STD testing offered.  Vaccinations reviewed and declined following counseling.  Dental care up to date.  Will be due for colon cancer screening next month.  Anal cancer screening up to date.

## 2024-01-25 LAB — T-HELPER CELL (CD4) - (RCID CLINIC ONLY)
CD4 % Helper T Cell: 40 % (ref 33–65)
CD4 T Cell Abs: 653 /uL (ref 400–1790)

## 2024-01-27 LAB — RPR: RPR Ser Ql: NONREACTIVE

## 2024-01-27 LAB — COMPREHENSIVE METABOLIC PANEL WITH GFR
AG Ratio: 1.8 (calc) (ref 1.0–2.5)
ALT: 12 U/L (ref 9–46)
AST: 18 U/L (ref 10–35)
Albumin: 4.2 g/dL (ref 3.6–5.1)
Alkaline phosphatase (APISO): 78 U/L (ref 35–144)
BUN: 10 mg/dL (ref 7–25)
CO2: 28 mmol/L (ref 20–32)
Calcium: 9.2 mg/dL (ref 8.6–10.3)
Chloride: 105 mmol/L (ref 98–110)
Creat: 0.84 mg/dL (ref 0.70–1.35)
Globulin: 2.4 g/dL (ref 1.9–3.7)
Glucose, Bld: 84 mg/dL (ref 65–99)
Potassium: 4.4 mmol/L (ref 3.5–5.3)
Sodium: 141 mmol/L (ref 135–146)
Total Bilirubin: 0.6 mg/dL (ref 0.2–1.2)
Total Protein: 6.6 g/dL (ref 6.1–8.1)
eGFR: 97 mL/min/1.73m2 (ref >=60)

## 2024-01-27 LAB — HIV-1 RNA QUANT-NO REFLEX-BLD
HIV 1 RNA Quant: <20 {copies}/mL — AB
HIV-1 RNA Quant, Log: <1.3 {Log_copies}/mL — AB

## 2024-01-27 LAB — LIPID PANEL
Cholesterol: 120 mg/dL (ref <200)
HDL: 40 mg/dL (ref >=40)
LDL Cholesterol (Calc): 63 mg/dL
Non-HDL Cholesterol (Calc): 80 mg/dL (ref <130)
Total CHOL/HDL Ratio: 3 (calc) (ref <5.0)
Triglycerides: 87 mg/dL (ref <150)

## 2024-01-28 ENCOUNTER — Ambulatory Visit: Payer: Self-pay | Admitting: Family

## 2024-02-09 ENCOUNTER — Telehealth: Payer: Self-pay

## 2024-02-09 NOTE — Telephone Encounter (Signed)
 Received fax from [employer] for updated PA. Will fax PA back along with note and labs. F: [phone] Lorenda CHRISTELLA Code, RMA

## 2024-02-14 ENCOUNTER — Telehealth: Payer: Self-pay

## 2024-02-14 ENCOUNTER — Other Ambulatory Visit (HOSPITAL_COMMUNITY): Payer: Self-pay

## 2024-02-14 NOTE — Telephone Encounter (Signed)
 Received notification from [facility] regarding a prior authorization for Biktarvy . Authorization has been APPROVED from 02/14/24 to 02/13/26.   Per test claim, copay for 30 days supply is $0.00  Patient can continue to fill through [facility]  Authorization # [id number] Phone # [phone]

## 2024-04-18 ENCOUNTER — Other Ambulatory Visit: Payer: Self-pay

## 2024-04-18 ENCOUNTER — Telehealth: Payer: Self-pay | Admitting: Cardiology

## 2024-04-18 DIAGNOSIS — I350 Nonrheumatic aortic (valve) stenosis: Secondary | ICD-10-CM

## 2024-04-18 DIAGNOSIS — R600 Localized edema: Secondary | ICD-10-CM

## 2024-04-18 DIAGNOSIS — I10 Essential (primary) hypertension: Secondary | ICD-10-CM

## 2024-04-18 MED ORDER — FUROSEMIDE 20 MG PO TABS: 20.0000 mg | ORAL_TABLET | Freq: Every day | ORAL | 3 refills | Status: DC

## 2024-04-18 NOTE — Telephone Encounter (Signed)
 Spoke with pt to let him know the recommendations of Dr. Michele. Pt asked if we could get echo scheduled prior to his appt with Dr. Pietro on 1/28, explained that we can ask but out echo appts are booked out about a month so unsure if this would be possible. ED precautions given to pt if CP worsens, SOB worsens, symptoms become more frequent, etc. Pt verbalized understanding of plan and had no further questions at this time. Rx for Lasix  20 mg has been sent to pt preferred pharmacy. BMP has been ordered (Dr. Pietro Cc'd on results) and released to [employer]- physical order has been mailed to pt.

## 2024-04-18 NOTE — Telephone Encounter (Signed)
 Sure can refill nitroglycerin  as well 30 tabs no refill.  If more than two are needed 5 minutes apart he goes to ER.  I reviewed his medication list - no documented Pde5i make sure he is aware to drug to drug interactions.   Dr. Jataya Wann

## 2024-04-18 NOTE — Telephone Encounter (Signed)
 Spoke with pt who states he has been having chest discomfort (on a scale of 1-5, the discomfort is 1.5-2) that lasts a couple of minutes each episode. Pt says the episodes can occur a couple of times a day but there are some days where the chest discomfort doesn't occur at all. Pt states the shortness of breath occurs more frequently than the chest discomfort. Pt states he has bilateral LE edema. Tiredness has increased recently. Per Dr. Vertie OV note from July 2025, he has discussed the possibility of a valve replacement with the pt d/t moderate-severe AS. Echo was not planned to be done until May 2026. Spoke with Dr. Michele (DOD 1/20) who agreed that moving echocardiogram up sooner would be appropriate. Message has been sent to [facility] to reschedule echo. Pt states that BP yesterday (1/19) was 142/72 which is about his normal range. Pt states he has gained about 15 lbs since Thanksgiving and when looking back he knows there have been times where he has gained 5 lbs in a week but did not say if that has happened recently. Waiting for any further recommendations at this time.

## 2024-04-18 NOTE — Telephone Encounter (Signed)
 Start Lasix  20 mg p.o. every morning. BMP in one week to check renal function and electrolytes. If patient has chest pain substernally, worse with exertion, improves with resting please have him go to the ER for further evaluation. Daily weights  Dr. Michele

## 2024-04-18 NOTE — Telephone Encounter (Signed)
 Pt c/o of Chest Pain: STAT if active (IN THIS MOMENT) CP, including tightness, pressure, jaw pain, shoulder/upper arm/back pain, SOB, nausea, and vomiting.  1. Are you having CP right now (tightness, pressure, or discomfort)? Tightness   2. Are you experiencing any other symptoms (ex. SOB, nausea, vomiting, sweating)? Sob   3. How long have you been experiencing CP? Couple of weeks  4. Is your CP continuous or coming and going? Coming and going   5. Have you taken Nitroglycerin ? no  6. If CP returns before callback, please consider calling 911.   Patient has 6 months f/u  ?

## 2024-04-19 ENCOUNTER — Other Ambulatory Visit: Payer: Self-pay

## 2024-04-19 ENCOUNTER — Inpatient Hospital Stay (HOSPITAL_COMMUNITY)
Admission: EM | Admit: 2024-04-19 | Discharge: 2024-04-21 | DRG: 291 | Disposition: A | Discharge status: Home / Self Care | Attending: Internal Medicine | Admitting: Internal Medicine

## 2024-04-19 ENCOUNTER — Emergency Department (HOSPITAL_COMMUNITY)

## 2024-04-19 ENCOUNTER — Encounter (HOSPITAL_COMMUNITY): Payer: Self-pay | Admitting: Emergency Medicine

## 2024-04-19 DIAGNOSIS — Z7982 Long term (current) use of aspirin: Secondary | ICD-10-CM

## 2024-04-19 DIAGNOSIS — Z888 Allergy status to other drugs, medicaments and biological substances status: Secondary | ICD-10-CM

## 2024-04-19 DIAGNOSIS — I252 Old myocardial infarction: Secondary | ICD-10-CM

## 2024-04-19 DIAGNOSIS — F17210 Nicotine dependence, cigarettes, uncomplicated: Secondary | ICD-10-CM | POA: Diagnosis present

## 2024-04-19 DIAGNOSIS — Z79899 Other long term (current) drug therapy: Secondary | ICD-10-CM

## 2024-04-19 DIAGNOSIS — J45909 Unspecified asthma, uncomplicated: Secondary | ICD-10-CM | POA: Diagnosis present

## 2024-04-19 DIAGNOSIS — Z85820 Personal history of malignant melanoma of skin: Secondary | ICD-10-CM

## 2024-04-19 DIAGNOSIS — I509 Heart failure, unspecified: Secondary | ICD-10-CM

## 2024-04-19 DIAGNOSIS — Z21 Asymptomatic human immunodeficiency virus [HIV] infection status: Secondary | ICD-10-CM | POA: Diagnosis present

## 2024-04-19 DIAGNOSIS — Q2381 Bicuspid aortic valve: Secondary | ICD-10-CM

## 2024-04-19 DIAGNOSIS — E7800 Pure hypercholesterolemia, unspecified: Secondary | ICD-10-CM | POA: Diagnosis present

## 2024-04-19 DIAGNOSIS — Z79890 Hormone replacement therapy (postmenopausal): Secondary | ICD-10-CM

## 2024-04-19 DIAGNOSIS — Z7985 Long-term (current) use of injectable non-insulin antidiabetic drugs: Secondary | ICD-10-CM

## 2024-04-19 DIAGNOSIS — E039 Hypothyroidism, unspecified: Secondary | ICD-10-CM | POA: Diagnosis present

## 2024-04-19 DIAGNOSIS — I2510 Atherosclerotic heart disease of native coronary artery without angina pectoris: Secondary | ICD-10-CM | POA: Diagnosis present

## 2024-04-19 DIAGNOSIS — E119 Type 2 diabetes mellitus without complications: Secondary | ICD-10-CM | POA: Diagnosis present

## 2024-04-19 DIAGNOSIS — Z882 Allergy status to sulfonamides status: Secondary | ICD-10-CM

## 2024-04-19 DIAGNOSIS — I5031 Acute diastolic (congestive) heart failure: Secondary | ICD-10-CM | POA: Diagnosis present

## 2024-04-19 DIAGNOSIS — Z955 Presence of coronary angioplasty implant and graft: Secondary | ICD-10-CM

## 2024-04-19 DIAGNOSIS — R0789 Other chest pain: Secondary | ICD-10-CM | POA: Diagnosis present

## 2024-04-19 DIAGNOSIS — I350 Nonrheumatic aortic (valve) stenosis: Secondary | ICD-10-CM | POA: Diagnosis present

## 2024-04-19 DIAGNOSIS — I110 Hypertensive heart disease with heart failure: Principal | ICD-10-CM | POA: Diagnosis present

## 2024-04-19 DIAGNOSIS — R079 Chest pain, unspecified: Principal | ICD-10-CM

## 2024-04-19 DIAGNOSIS — E785 Hyperlipidemia, unspecified: Secondary | ICD-10-CM | POA: Diagnosis present

## 2024-04-19 DIAGNOSIS — Z7984 Long term (current) use of oral hypoglycemic drugs: Secondary | ICD-10-CM

## 2024-04-19 DIAGNOSIS — G4733 Obstructive sleep apnea (adult) (pediatric): Secondary | ICD-10-CM | POA: Diagnosis present

## 2024-04-19 DIAGNOSIS — Z23 Encounter for immunization: Secondary | ICD-10-CM

## 2024-04-19 DIAGNOSIS — I5033 Acute on chronic diastolic (congestive) heart failure: Secondary | ICD-10-CM | POA: Diagnosis present

## 2024-04-19 DIAGNOSIS — Z8249 Family history of ischemic heart disease and other diseases of the circulatory system: Secondary | ICD-10-CM

## 2024-04-19 LAB — CBC
HCT: 39.9 % (ref 39.0–52.0)
Hemoglobin: 13.3 g/dL (ref 13.0–17.0)
MCH: 33.3 pg (ref 26.0–34.0)
MCHC: 33.3 g/dL (ref 30.0–36.0)
MCV: 99.8 fL (ref 80.0–100.0)
Platelets: 148 K/uL — ABNORMAL LOW (ref 150–400)
RBC: 4 MIL/uL — ABNORMAL LOW (ref 4.22–5.81)
RDW: 13 % (ref 11.5–15.5)
WBC: 5.4 K/uL (ref 4.0–10.5)
nRBC: 0 % (ref 0.0–0.2)

## 2024-04-19 LAB — BASIC METABOLIC PANEL WITH GFR
Anion gap: 13 (ref 5–15)
BUN: 16 mg/dL (ref 8–23)
CO2: 25 mmol/L (ref 22–32)
Calcium: 8.9 mg/dL (ref 8.9–10.3)
Chloride: 106 mmol/L (ref 98–111)
Creatinine, Ser: 0.99 mg/dL (ref 0.61–1.24)
GFR, Estimated: >60 mL/min
Glucose, Bld: 150 mg/dL — ABNORMAL HIGH (ref 70–99)
Potassium: 3.7 mmol/L (ref 3.5–5.1)
Sodium: 144 mmol/L (ref 135–145)

## 2024-04-19 LAB — TROPONIN T, HIGH SENSITIVITY: Troponin T High Sensitivity: 17 ng/L (ref 0–19)

## 2024-04-19 LAB — PRO BRAIN NATRIURETIC PEPTIDE: Pro Brain Natriuretic Peptide: 473 pg/mL — ABNORMAL HIGH

## 2024-04-19 MED ORDER — NITROGLYCERIN 0.4 MG SL SUBL: 0.4000 mg | SUBLINGUAL_TABLET | SUBLINGUAL | 1 refills | Status: AC | PRN

## 2024-04-19 NOTE — ED Triage Notes (Signed)
 Chest pain all day today. Left side radiates through to back with nausea and SOB. Gained 15 lbs in 1.58month. Increased swelling in lower extremities prescribed lasix  yesterday.

## 2024-04-19 NOTE — ED Notes (Signed)
 Patient at triage 3 .

## 2024-04-19 NOTE — ED Notes (Signed)
 PT states he took a nitroglycerin  at 7pm tonight

## 2024-04-19 NOTE — ED Triage Notes (Signed)
 Complains of headache coming and going right now 10/10

## 2024-04-19 NOTE — ED Triage Notes (Addendum)
 Patient reports intermittent left chest pain with SOB and emesis for several weeks . History of CAD/Stents.

## 2024-04-20 ENCOUNTER — Observation Stay (HOSPITAL_COMMUNITY)

## 2024-04-20 ENCOUNTER — Other Ambulatory Visit: Payer: Self-pay

## 2024-04-20 ENCOUNTER — Encounter (HOSPITAL_COMMUNITY): Payer: Self-pay | Admitting: Internal Medicine

## 2024-04-20 DIAGNOSIS — E782 Mixed hyperlipidemia: Secondary | ICD-10-CM | POA: Diagnosis not present

## 2024-04-20 DIAGNOSIS — Z79890 Hormone replacement therapy (postmenopausal): Secondary | ICD-10-CM | POA: Diagnosis not present

## 2024-04-20 DIAGNOSIS — Z8249 Family history of ischemic heart disease and other diseases of the circulatory system: Secondary | ICD-10-CM | POA: Diagnosis not present

## 2024-04-20 DIAGNOSIS — I2510 Atherosclerotic heart disease of native coronary artery without angina pectoris: Secondary | ICD-10-CM | POA: Diagnosis present

## 2024-04-20 DIAGNOSIS — Z21 Asymptomatic human immunodeficiency virus [HIV] infection status: Secondary | ICD-10-CM

## 2024-04-20 DIAGNOSIS — E119 Type 2 diabetes mellitus without complications: Secondary | ICD-10-CM | POA: Diagnosis present

## 2024-04-20 DIAGNOSIS — Z23 Encounter for immunization: Secondary | ICD-10-CM | POA: Diagnosis not present

## 2024-04-20 DIAGNOSIS — R0789 Other chest pain: Secondary | ICD-10-CM | POA: Diagnosis present

## 2024-04-20 DIAGNOSIS — I350 Nonrheumatic aortic (valve) stenosis: Secondary | ICD-10-CM

## 2024-04-20 DIAGNOSIS — E7800 Pure hypercholesterolemia, unspecified: Secondary | ICD-10-CM | POA: Diagnosis present

## 2024-04-20 DIAGNOSIS — I5021 Acute systolic (congestive) heart failure: Secondary | ICD-10-CM | POA: Diagnosis not present

## 2024-04-20 DIAGNOSIS — I252 Old myocardial infarction: Secondary | ICD-10-CM | POA: Diagnosis not present

## 2024-04-20 DIAGNOSIS — I5033 Acute on chronic diastolic (congestive) heart failure: Secondary | ICD-10-CM | POA: Diagnosis present

## 2024-04-20 DIAGNOSIS — Z7985 Long-term (current) use of injectable non-insulin antidiabetic drugs: Secondary | ICD-10-CM | POA: Diagnosis not present

## 2024-04-20 DIAGNOSIS — Z888 Allergy status to other drugs, medicaments and biological substances status: Secondary | ICD-10-CM | POA: Diagnosis not present

## 2024-04-20 DIAGNOSIS — Z882 Allergy status to sulfonamides status: Secondary | ICD-10-CM | POA: Diagnosis not present

## 2024-04-20 DIAGNOSIS — Z955 Presence of coronary angioplasty implant and graft: Secondary | ICD-10-CM | POA: Diagnosis not present

## 2024-04-20 DIAGNOSIS — J45909 Unspecified asthma, uncomplicated: Secondary | ICD-10-CM | POA: Diagnosis present

## 2024-04-20 DIAGNOSIS — Z7984 Long term (current) use of oral hypoglycemic drugs: Secondary | ICD-10-CM | POA: Diagnosis not present

## 2024-04-20 DIAGNOSIS — I5031 Acute diastolic (congestive) heart failure: Secondary | ICD-10-CM | POA: Diagnosis present

## 2024-04-20 DIAGNOSIS — E785 Hyperlipidemia, unspecified: Secondary | ICD-10-CM | POA: Diagnosis present

## 2024-04-20 DIAGNOSIS — I110 Hypertensive heart disease with heart failure: Secondary | ICD-10-CM | POA: Diagnosis present

## 2024-04-20 DIAGNOSIS — F17210 Nicotine dependence, cigarettes, uncomplicated: Secondary | ICD-10-CM | POA: Diagnosis present

## 2024-04-20 DIAGNOSIS — Z79899 Other long term (current) drug therapy: Secondary | ICD-10-CM | POA: Diagnosis not present

## 2024-04-20 DIAGNOSIS — E039 Hypothyroidism, unspecified: Secondary | ICD-10-CM | POA: Diagnosis present

## 2024-04-20 DIAGNOSIS — Z85820 Personal history of malignant melanoma of skin: Secondary | ICD-10-CM | POA: Diagnosis not present

## 2024-04-20 DIAGNOSIS — Z7982 Long term (current) use of aspirin: Secondary | ICD-10-CM | POA: Diagnosis not present

## 2024-04-20 DIAGNOSIS — G4733 Obstructive sleep apnea (adult) (pediatric): Secondary | ICD-10-CM | POA: Diagnosis present

## 2024-04-20 LAB — TROPONIN T, HIGH SENSITIVITY
Troponin T High Sensitivity: 17 ng/L (ref 0–19)
Troponin T High Sensitivity: 14 ng/L (ref 0–19)

## 2024-04-20 LAB — COMPREHENSIVE METABOLIC PANEL WITH GFR
ALT: 21 U/L (ref 0–44)
AST: 24 U/L (ref 15–41)
Albumin: 4 g/dL (ref 3.5–5.0)
Alkaline Phosphatase: 79 U/L (ref 38–126)
Anion gap: 11 (ref 5–15)
BUN: 14 mg/dL (ref 8–23)
CO2: 26 mmol/L (ref 22–32)
Calcium: 8.8 mg/dL — ABNORMAL LOW (ref 8.9–10.3)
Chloride: 107 mmol/L (ref 98–111)
Creatinine, Ser: 0.85 mg/dL (ref 0.61–1.24)
GFR, Estimated: >60 mL/min
Glucose, Bld: 83 mg/dL (ref 70–99)
Potassium: 3.2 mmol/L — ABNORMAL LOW (ref 3.5–5.1)
Sodium: 143 mmol/L (ref 135–145)
Total Bilirubin: 0.3 mg/dL (ref 0.0–1.2)
Total Protein: 6.6 g/dL (ref 6.5–8.1)

## 2024-04-20 LAB — CBC WITH DIFFERENTIAL/PLATELET
Abs Immature Granulocytes: 0.02 K/uL (ref 0.00–0.07)
Basophils Absolute: 0 K/uL (ref 0.0–0.1)
Basophils Relative: 1 %
Eosinophils Absolute: 0.4 K/uL (ref 0.0–0.5)
Eosinophils Relative: 7 %
HCT: 36.6 % — ABNORMAL LOW (ref 39.0–52.0)
Hemoglobin: 12.5 g/dL — ABNORMAL LOW (ref 13.0–17.0)
Immature Granulocytes: 0 %
Lymphocytes Relative: 28 %
Lymphs Abs: 1.6 K/uL (ref 0.7–4.0)
MCH: 33.5 pg (ref 26.0–34.0)
MCHC: 34.2 g/dL (ref 30.0–36.0)
MCV: 98.1 fL (ref 80.0–100.0)
Monocytes Absolute: 0.7 K/uL (ref 0.1–1.0)
Monocytes Relative: 13 %
Neutro Abs: 2.9 K/uL (ref 1.7–7.7)
Neutrophils Relative %: 51 %
Platelets: 143 K/uL — ABNORMAL LOW (ref 150–400)
RBC: 3.73 MIL/uL — ABNORMAL LOW (ref 4.22–5.81)
RDW: 12.9 % (ref 11.5–15.5)
WBC: 5.7 K/uL (ref 4.0–10.5)
nRBC: 0 % (ref 0.0–0.2)

## 2024-04-20 LAB — CBG MONITORING, ED
Glucose-Capillary: 186 mg/dL — ABNORMAL HIGH (ref 70–99)
Glucose-Capillary: 117 mg/dL — ABNORMAL HIGH (ref 70–99)

## 2024-04-20 LAB — HEMOGLOBIN A1C
Hgb A1c MFr Bld: 5.3 % (ref 4.8–5.6)
Mean Plasma Glucose: 105.41 mg/dL

## 2024-04-20 LAB — TSH: TSH: 11.1 u[IU]/mL — ABNORMAL HIGH (ref 0.350–4.500)

## 2024-04-20 LAB — MAGNESIUM: Magnesium: 2.1 mg/dL (ref 1.7–2.4)

## 2024-04-20 LAB — GLUCOSE, CAPILLARY
Glucose-Capillary: 126 mg/dL — ABNORMAL HIGH (ref 70–99)
Glucose-Capillary: 107 mg/dL — ABNORMAL HIGH (ref 70–99)

## 2024-04-20 LAB — PRO BRAIN NATRIURETIC PEPTIDE: Pro Brain Natriuretic Peptide: 470 pg/mL — ABNORMAL HIGH

## 2024-04-20 MED ORDER — ACETAMINOPHEN 650 MG RE SUPP: 650.0000 mg | Freq: Four times a day (QID) | RECTAL | Status: DC | PRN

## 2024-04-20 MED ORDER — MORPHINE SULFATE (PF) 2 MG/ML IV SOLN
1.0000 mg | INTRAVENOUS | Status: DC | PRN
  Administered 2024-04-20 – 2024-04-21 (×4): 1 mg via INTRAVENOUS
  Filled 2024-04-20 (×4): qty 1

## 2024-04-20 MED ORDER — ASPIRIN 81 MG PO TBEC
81.0000 mg | DELAYED_RELEASE_TABLET | Freq: Every day | ORAL | Status: DC
  Administered 2024-04-20 – 2024-04-21 (×2): 81 mg via ORAL
  Filled 2024-04-20 (×2): qty 1

## 2024-04-20 MED ORDER — POTASSIUM CHLORIDE CRYS ER 20 MEQ PO TBCR
40.0000 meq | EXTENDED_RELEASE_TABLET | Freq: Once | ORAL | Status: AC
  Administered 2024-04-20: 40 meq via ORAL
  Filled 2024-04-20: qty 2

## 2024-04-20 MED ORDER — OXYCODONE HCL 5 MG PO TABS
5.0000 mg | ORAL_TABLET | Freq: Four times a day (QID) | ORAL | Status: DC | PRN
  Administered 2024-04-20 – 2024-04-21 (×2): 5 mg via ORAL
  Filled 2024-04-20 (×2): qty 1

## 2024-04-20 MED ORDER — FENTANYL CITRATE (PF) 50 MCG/ML IJ SOSY
25.0000 ug | PREFILLED_SYRINGE | INTRAMUSCULAR | Status: DC | PRN
  Administered 2024-04-20: 25 ug via INTRAVENOUS
  Filled 2024-04-20: qty 1

## 2024-04-20 MED ORDER — ONDANSETRON HCL 4 MG/2ML IJ SOLN: 4.0000 mg | Freq: Four times a day (QID) | INTRAMUSCULAR | Status: DC | PRN

## 2024-04-20 MED ORDER — BICTEGRAVIR-EMTRICITAB-TENOFOV 50-200-25 MG PO TABS
1.0000 | ORAL_TABLET | Freq: Every day | ORAL | Status: DC
  Administered 2024-04-20 – 2024-04-21 (×2): 1 via ORAL
  Filled 2024-04-20 (×2): qty 1

## 2024-04-20 MED ORDER — MELATONIN 3 MG PO TABS: 3.0000 mg | ORAL_TABLET | Freq: Every evening | ORAL | Status: DC | PRN

## 2024-04-20 MED ORDER — LEVOTHYROXINE SODIUM 100 MCG PO TABS
200.0000 ug | ORAL_TABLET | Freq: Every day | ORAL | Status: DC
  Administered 2024-04-20 – 2024-04-21 (×2): 200 ug via ORAL
  Filled 2024-04-20 (×2): qty 2

## 2024-04-20 MED ORDER — INFLUENZA VAC SPLIT HIGH-DOSE 0.5 ML IM SUSY
0.5000 mL | PREFILLED_SYRINGE | INTRAMUSCULAR | Status: AC
  Administered 2024-04-21: 0.5 mL via INTRAMUSCULAR
  Filled 2024-04-20: qty 0.5

## 2024-04-20 MED ORDER — ROSUVASTATIN CALCIUM 20 MG PO TABS
40.0000 mg | ORAL_TABLET | Freq: Every day | ORAL | Status: DC
  Administered 2024-04-20 – 2024-04-21 (×2): 40 mg via ORAL
  Filled 2024-04-20 (×2): qty 2

## 2024-04-20 MED ORDER — INSULIN ASPART 100 UNIT/ML IJ SOLN
0.0000 [IU] | Freq: Three times a day (TID) | INTRAMUSCULAR | Status: DC
  Administered 2024-04-20: 1 [IU] via SUBCUTANEOUS
  Administered 2024-04-20: 2 [IU] via SUBCUTANEOUS
  Filled 2024-04-20: qty 2
  Filled 2024-04-20: qty 1

## 2024-04-20 MED ORDER — FUROSEMIDE 10 MG/ML IJ SOLN
20.0000 mg | Freq: Two times a day (BID) | INTRAMUSCULAR | Status: DC
  Administered 2024-04-20 – 2024-04-21 (×3): 20 mg via INTRAVENOUS
  Filled 2024-04-20 (×3): qty 2

## 2024-04-20 MED ORDER — ACETAMINOPHEN 325 MG PO TABS
650.0000 mg | ORAL_TABLET | Freq: Four times a day (QID) | ORAL | Status: DC | PRN
  Administered 2024-04-20: 650 mg via ORAL
  Filled 2024-04-20: qty 2

## 2024-04-20 MED ORDER — ACETAMINOPHEN 325 MG PO TABS
325.0000 mg | ORAL_TABLET | Freq: Once | ORAL | Status: AC
  Administered 2024-04-20: 325 mg via ORAL
  Filled 2024-04-20: qty 1

## 2024-04-20 MED ORDER — OXYCODONE-ACETAMINOPHEN 5-325 MG PO TABS
1.0000 | ORAL_TABLET | Freq: Once | ORAL | Status: AC
  Administered 2024-04-20: 1 via ORAL
  Filled 2024-04-20: qty 1

## 2024-04-20 MED ORDER — FUROSEMIDE 10 MG/ML IJ SOLN
40.0000 mg | Freq: Once | INTRAMUSCULAR | Status: AC
  Administered 2024-04-20: 40 mg via INTRAVENOUS
  Filled 2024-04-20: qty 4

## 2024-04-20 NOTE — ED Notes (Signed)
 800 ml urine emptied

## 2024-04-20 NOTE — Progress Notes (Signed)
 Patient admitted after midnight, please see H&P.  Here with SOB-- suspect CHF-- has h/o severe AS.  Cardiology consulted.  Harlene Bowl DO

## 2024-04-20 NOTE — ED Provider Notes (Signed)
 " [place] EMERGENCY DEPARTMENT AT [facility] Provider Note   CSN: [id number] Arrival date & time: 04/19/24  2144     Patient presents with: Chest Pain   Ethan Farrell is a 64 y.o. male.   Patient presents to the emergency department for evaluation of chest pain.  Patient also endorses shortness of breath.  Patient does have a history of coronary artery disease has had stenting of his right coronary artery in 2011 and repeat stenting in 2018.  He also reports that he has been told that his aortic valve will likely need to be replaced at some point.  For the last couple of weeks, patient has been experiencing intermittent chest pain.  He has been using nitroglycerin  for this.  He has contacted his primary cardiologist a couple of times over the last couple of weeks and has a follow-up appointment next week.  Echo was planned at that time.  Patient reports that he has had increasing shortness of breath and gained 15 pounds in about 6 weeks.  He has not seen much swelling of his legs but reports that his abdomen is swollen and distended.  Cardiology sent in a prescription for Lasix  yesterday, has taken only 1 dose.       Prior to Admission medications  Medication Sig Start Date End Date Taking? Authorizing Provider  aspirin  EC 81 MG tablet Take 81 mg by mouth daily. Swallow whole.    [provider]  bictegravir-emtricitabine -tenofovir  AF (BIKTARVY ) 50-200-25 MG TABS tablet Take 1 tablet by mouth daily. 01/24/24   Calone, Gregory D, FNP  furosemide  (LASIX ) 20 MG tablet Take 1 tablet (20 mg total) by mouth daily. 04/18/24   Tolia, Sunit, DO  levothyroxine  (SYNTHROID ) 200 MCG tablet Take 200 mcg by mouth daily before breakfast. 07/24/19   [provider]  lisinopril  (ZESTRIL ) 20 MG tablet TAKE 1 TABLET(20 MG) BY MOUTH DAILY 10/18/23   Pietro Redell RAMAN, MD  metFORMIN (GLUCOPHAGE) 1000 MG tablet Take 1,000 mg by mouth daily. 07/24/19   [provider]   NICODERM CQ 21 MG/24HR patch 1 patch to skin Transdermal Once a day; Duration: 42 days 10/21/23   [provider]  nitroGLYCERIN  (NITROSTAT ) 0.4 MG SL tablet Place 1 tablet (0.4 mg total) under the tongue every 5 (five) minutes as needed. 04/19/24   Tolia, Sunit, DO  NON FORMULARY Pt uses a c-pap nightly    [provider]  rosuvastatin  (CRESTOR ) 40 MG tablet Take 40 mg by mouth daily.    [provider]  tirzepatide CLOYDE) 15 MG/0.5ML Pen Inject 15 mg into the skin once a week. 12/04/20   [provider]  traMADol  (ULTRAM ) 50 MG tablet Take 1-2 tablets (50-100 mg total) by mouth every 6 (six) hours as needed. 09/08/23   Debby Hila, MD    Allergies: Prednisone and Sulfa antibiotics    Review of Systems  Updated Vital Signs BP (!) 161/76   Pulse 67   Temp 98.2 F (36.8 C) (Oral)   Resp 19   Ht 6' 1 (1.854 m)   Wt 105.2 kg   SpO2 95%   BMI 30.60 kg/m   Physical Exam Vitals and nursing note reviewed.  Constitutional:      General: He is not in acute distress.    Appearance: He is well-developed.  HENT:     Head: Normocephalic and atraumatic.     Mouth/Throat:     Mouth: Mucous membranes are moist.  Eyes:  General: Vision grossly intact. Gaze aligned appropriately.     Extraocular Movements: Extraocular movements intact.     Conjunctiva/sclera: Conjunctivae normal.  Cardiovascular:     Rate and Rhythm: Normal rate and regular rhythm.     Pulses: Normal pulses.     Heart sounds: Normal heart sounds, S1 normal and S2 normal. No murmur heard.    No friction rub. No gallop.  Pulmonary:     Effort: Pulmonary effort is normal. No respiratory distress.     Breath sounds: Normal breath sounds.  Abdominal:     Palpations: Abdomen is soft.     Tenderness: There is no abdominal tenderness. There is no guarding or rebound.     Hernia: No hernia is present.  Musculoskeletal:        General: No swelling.     Cervical back: Full passive  range of motion without pain, normal range of motion and neck supple. No pain with movement, spinous process tenderness or muscular tenderness. Normal range of motion.     Right lower leg: No edema.     Left lower leg: No edema.  Skin:    General: Skin is warm and dry.     Capillary Refill: Capillary refill takes less than 2 seconds.     Findings: No ecchymosis, erythema, lesion or wound.  Neurological:     Mental Status: He is alert and oriented to person, place, and time.     GCS: GCS eye subscore is 4. GCS verbal subscore is 5. GCS motor subscore is 6.     Cranial Nerves: Cranial nerves 2-12 are intact.     Sensory: Sensation is intact.     Motor: Motor function is intact. No weakness or abnormal muscle tone.     Coordination: Coordination is intact.  Psychiatric:        Mood and Affect: Mood normal.        Speech: Speech normal.        Behavior: Behavior normal.     (all labs ordered are listed, but only abnormal results are displayed) Labs Reviewed  BASIC METABOLIC PANEL WITH GFR - Abnormal; Notable for the following components:      Result Value   Glucose, Bld 150 (*)    All other components within normal limits  CBC - Abnormal; Notable for the following components:   RBC 4.00 (*)    Platelets 148 (*)    All other components within normal limits  PRO BRAIN NATRIURETIC PEPTIDE - Abnormal; Notable for the following components:   Pro Brain Natriuretic Peptide 473.0 (*)    All other components within normal limits  TROPONIN T, HIGH SENSITIVITY  TROPONIN T, HIGH SENSITIVITY    EKG: EKG Interpretation Date/Time:  Wednesday April 19 2024 21:53:34 EST Ventricular Rate:  72 PR Interval:  156 QRS Duration:  104 QT Interval:  404 QTC Calculation: 442 R Axis:   -7  Text Interpretation: Unusual P axis, possible ectopic atrial rhythm Minimal voltage criteria for LVH, may be normal variant ( Cornell product ) Septal infarct , age undetermined Abnormal ECG When compared with  ECG of 30-Jun-2023 15:28, PREVIOUS ECG IS PRESENT Confirmed by Haze Lonni PARAS ([id number]) on 04/20/2024 12:20:55 AM  Radiology: DG Chest 2 View Result Date: 04/19/2024 EXAM: 2 VIEW(S) XRAY OF THE CHEST 04/19/2024 10:20:00 PM COMPARISON: 08/07/2010, CT 06/01/2023. CLINICAL HISTORY: chest pain FINDINGS: LUNGS AND PLEURA: No focal pulmonary opacity. No pleural effusion. No pneumothorax. HEART AND MEDIASTINUM: No acute abnormality of the cardiac  and mediastinal silhouettes. BONES AND SOFT TISSUES: No acute osseous abnormality. IMPRESSION: 1. No acute cardiopulmonary pathology. Electronically signed by: Franky Crease MD 04/19/2024 10:25 PM EST RP Workstation: [id number]     Procedures   Medications Ordered in the ED  oxyCODONE -acetaminophen  (PERCOCET/ROXICET) 5-325 MG per tablet 1 tablet (has no administration in time range)  acetaminophen  (TYLENOL ) tablet 325 mg (has no administration in time range)  furosemide  (LASIX ) injection 40 mg (40 mg Intravenous Given 04/20/24 0154)                                    Medical Decision Making Amount and/or Complexity of Data Reviewed External Data Reviewed: labs, radiology, ECG and notes. Labs: ordered. Decision-making details documented in ED Course. Radiology: ordered and independent interpretation performed. Decision-making details documented in ED Course. ECG/medicine tests: ordered and independent interpretation performed. Decision-making details documented in ED Course.  Risk OTC drugs. Prescription drug management.   Differential Diagnosis considered includes, but not limited to: STEMI; NSTEMI; myocarditis; pericarditis; pulmonary embolism; aortic dissection; pneumothorax; pneumonia; gastritis; musculoskeletal pain; CHF; worsening aortic stenosis  Presents to the emergency department for evaluation of chest pain and shortness of breath.  Patient does have a history of CAD, having had previous stenting of the right coronary artery in 2011  and then again in 2018.  Patient did have a Myoview  stress in May 2025 that showed no ischemia, was felt to be a low risk study.  Echo at that time showed a normal ejection fraction of 60 to 65% with dilated left atrium, mild aortic regurgitation and moderate aortic stenosis.  Patient has been having pain and shortness of breath with weight gain over the last few weeks.  Records review reveals he has contacted cardiology multiple times and is scheduled for outpatient echo and follow-up in 1 week.  Additionally he was started on Lasix  as an outpatient.  Symptoms have not improved, has been using nitroglycerin  at home.  Patient is EKG is largely unchanged from prior.  Serial troponins are not elevated.  BNP is elevated.  Patient's blood pressure was elevated on arrival at 185/69.  I did not address this initially because of his aortic stenosis and he has actually improved without intervention.  Will initiate diuresis with IV Lasix .  Patient complaining of headache after his nitroglycerin , given Tylenol  and Percocet.  I believe patient would be best served being admitted for diuresis and repeat echo, cardiology evaluation.  He is at risk for acutely worsening aortic stenosis causing his pain and heart failure.  Attempts to reach cardiology were unsuccessful.  Will asked hospitalist to admit.  CRITICAL CARE Performed by: Lonni JINNY Seats   Total critical care time: 30 minutes  Critical care time was exclusive of separately billable procedures and treating other patients.  Critical care was necessary to treat or prevent imminent or life-threatening deterioration.  Critical care was time spent personally by me on the following activities: development of treatment plan with patient and/or surrogate as well as nursing, discussions with consultants, evaluation of patient's response to treatment, examination of patient, obtaining history from patient or surrogate, ordering and performing treatments and  interventions, ordering and review of laboratory studies, ordering and review of radiographic studies, pulse oximetry and re-evaluation of patient's condition.      Final diagnoses:  Chest pain, unspecified type  Acute congestive heart failure, unspecified heart failure type [facility])    ED Discharge Orders  None          Haze Lonni PARAS, MD 04/20/24 0159  "

## 2024-04-20 NOTE — H&P (Signed)
 " History and Physical      Ethan Farrell FMW:[id number] DOB: 12-24-1960 DOA: 04/19/2024; DOS: 04/20/2024  PCP: Pia Kerney SQUIBB, MD  Patient coming from: home   I have personally briefly reviewed patient's old medical records in [facility]  Chief Complaint: sob  HPI: Ethan Farrell is a 64 y.o. male with medical history significant for severe aortic stenosis, who is admitted to [facility] on 04/19/2024 with concern for new diagnosis of acute diastolic heart failure after presenting from home to [facility] ED complaining of shortness of breath.   Patient with known history of severe aortic stenosis per most recent echocardiogram on 08/04/2023, he presents with 2 weeks of progressive shortness of breath associate with intermittent nonexertional chest pain, without improvement with nitroglycerin .  He notes associated 15 pound weight gain over the last few weeks, as well as worsening of peripheral edema.  No subjective fever, chills rigors, or generalized myalgias.  Started on Lasix  by his outpatient cardiologist, Dr. Conley but has noted no significant improvement in his presenting symptoms.    ED Course:  Vital signs in the ED were notable for the following: Afebrile  Labs were notable for the following: BMP proBNP 473.  High sensitive troponin T x 2 evaluated by 17  Per my interpretation, EKG in ED demonstrated the following: Sinus rhythm without overt evidence of acute ischemic changes  Imaging in the ED, per corresponding formal radiology read, was notable for the following: Chest x-ray showed no evidence of acute cardiopulmonary process.  EDP is paged on-call cardiology to request consult.  Subsequently, the patient was admitted for further evaluation management of concern for new diagnosis of acute diastolic heart failure in setting of atypical chest pain, and concern for progression of known severe aortic stenosis.    Review of Systems: As per HPI otherwise 10 point review  of systems negative.   Past Medical History:  Diagnosis Date   Arthritis    Asthma    Cancer (HCC)    melenoma   Coronary artery disease    DM type 2 (diabetes mellitus, type 2) (HCC)    Heart attack (HCC) 11/2009   2 stents   Heart murmur    HIV infection (HCC)    undetectable   HTN (hypertension)    Hypercholesterolemia    Hypothyroid    Neuromuscular disorder (HCC)    neuropathy fingers   OSA (obstructive sleep apnea)    Pneumonia    Seasonal allergies     Past Surgical History:  Procedure Laterality Date   APPENDECTOMY     BACK SURGERY     CARPAL TUNNEL RELEASE Left    HIGH RESOLUTION ANOSCOPY N/A 09/08/2023   Procedure: ANOSCOPY, HIGH RESOLUTION WITH BIOPSY ABLATION;  Surgeon: Debby Hila, MD;  Location: [facility] ORS;  Service: General;  Laterality: N/A;  High resolution anoscopy with possible biopsy/ablation   KNEE SURGERY     left   right shoulder     right shoulder spurs   ROTATOR CUFF REPAIR     right   UMBILICAL HERNIA REPAIR     x2    Social History:  reports that he has been smoking cigarettes. He has a 25 pack-year smoking history. He has never used smokeless tobacco. He reports that he does not drink alcohol and does not use drugs.   Allergies[1]  Family History  Problem Relation Age of Onset   Hypertension Father    Heart attack Paternal Grandfather    Heart  failure Paternal Grandfather    Heart failure Paternal Grandmother    Heart attack Paternal Grandmother     Family history reviewed and not pertinent    Prior to Admission medications  Medication Sig Start Date End Date Taking? Authorizing Provider  aspirin  EC 81 MG tablet Take 81 mg by mouth daily. Swallow whole.    [provider]  bictegravir-emtricitabine -tenofovir  AF (BIKTARVY ) 50-200-25 MG TABS tablet Take 1 tablet by mouth daily. 01/24/24   Calone, Gregory D, FNP  furosemide  (LASIX ) 20 MG tablet Take 1 tablet (20 mg total) by mouth daily. 04/18/24   Tolia, Sunit, DO   levothyroxine  (SYNTHROID ) 200 MCG tablet Take 200 mcg by mouth daily before breakfast. 07/24/19   [provider]  lisinopril  (ZESTRIL ) 20 MG tablet TAKE 1 TABLET(20 MG) BY MOUTH DAILY 10/18/23   Pietro Redell RAMAN, MD  metFORMIN (GLUCOPHAGE) 1000 MG tablet Take 1,000 mg by mouth daily. 07/24/19   [provider]  NICODERM CQ 21 MG/24HR patch 1 patch to skin Transdermal Once a day; Duration: 42 days 10/21/23   [provider]  nitroGLYCERIN  (NITROSTAT ) 0.4 MG SL tablet Place 1 tablet (0.4 mg total) under the tongue every 5 (five) minutes as needed. 04/19/24   Tolia, Sunit, DO  NON FORMULARY Pt uses a c-pap nightly    [provider]  rosuvastatin  (CRESTOR ) 40 MG tablet Take 40 mg by mouth daily.    [provider]  tirzepatide CLOYDE) 15 MG/0.5ML Pen Inject 15 mg into the skin once a week. 12/04/20   [provider]  traMADol  (ULTRAM ) 50 MG tablet Take 1-2 tablets (50-100 mg total) by mouth every 6 (six) hours as needed. 09/08/23   Debby Hila, MD     Objective    Physical Exam: Vitals:   04/20/24 0100 04/20/24 0115 04/20/24 0130 04/20/24 0154  BP: (!) 148/69 (!) 149/70 (!) 161/76   Pulse:   67   Resp:   19   Temp:    98.2 F (36.8 C)  TempSrc:    Oral  SpO2:   95%   Weight:      Height:        General: appears to be stated age; alert, oriented Skin: warm, dry, no rash Head:  AT/[place] Mouth:  Oral mucosa membranes appear moist, normal dentition Neck: supple; trachea midline Heart:  RRR; did not appreciate any M/R/G Lungs: CTAB, did not appreciate any wheezes, rales, or rhonchi Abdomen: + BS; soft, ND, NT Vascular: 2+ pedal pulses b/l; 2+ radial pulses b/l Extremities: trace Edema bilateral lower extremities     Labs on Admission: I have personally reviewed following labs and imaging studies  CBC: Recent Labs  Lab 04/19/24 2225  WBC 5.4  HGB 13.3  HCT 39.9  MCV 99.8  PLT 148*   Basic Metabolic Panel: Recent Labs   Lab 04/19/24 2225  NA 144  K 3.7  CL 106  CO2 25  GLUCOSE 150*  BUN 16  CREATININE 0.99  CALCIUM  8.9   GFR: Estimated Creatinine Clearance: 94.7 mL/min (by C-G formula based on SCr of 0.99 mg/dL). Liver Function Tests: No results for input(s): AST, ALT, ALKPHOS, BILITOT, PROT, ALBUMIN in the last 168 hours. No results for input(s): LIPASE, AMYLASE in the last 168 hours. No results for input(s): AMMONIA in the last 168 hours. Coagulation Profile: No results for input(s): INR, PROTIME in the last 168 hours. Cardiac Enzymes: No results for input(s): CKTOTAL, CKMB, CKMBINDEX, TROPONINI in the last 168 hours.  BNP (last 3 results) Recent Labs    04/19/24 2225  PROBNP 473.0*   HbA1C: No results for input(s): HGBA1C in the last 72 hours. CBG: No results for input(s): GLUCAP in the last 168 hours. Lipid Profile: No results for input(s): CHOL, HDL, LDLCALC, TRIG, CHOLHDL, LDLDIRECT in the last 72 hours. Thyroid Function Tests: No results for input(s): TSH, T4TOTAL, FREET4, T3FREE, THYROIDAB in the last 72 hours. Anemia Panel: No results for input(s): VITAMINB12, FOLATE, FERRITIN, TIBC, IRON, RETICCTPCT in the last 72 hours. Urine analysis: No results found for: COLORURINE, APPEARANCEUR, LABSPEC, PHURINE, GLUCOSEU, HGBUR, BILIRUBINUR, KETONESUR, PROTEINUR, UROBILINOGEN, NITRITE, LEUKOCYTESUR  Radiological Exams on Admission: DG Chest 2 View Result Date: 04/19/2024 EXAM: 2 VIEW(S) XRAY OF THE CHEST 04/19/2024 10:20:00 PM COMPARISON: 08/07/2010, CT 06/01/2023. CLINICAL HISTORY: chest pain FINDINGS: LUNGS AND PLEURA: No focal pulmonary opacity. No pleural effusion. No pneumothorax. HEART AND MEDIASTINUM: No acute abnormality of the cardiac and mediastinal silhouettes. BONES AND SOFT TISSUES: No acute osseous abnormality. IMPRESSION: 1. No acute cardiopulmonary pathology. Electronically signed by:  Franky Crease MD 04/19/2024 10:25 PM EST RP Workstation: [id number]      Assessment/Plan   Principal Problem:   Acute diastolic heart failure (HCC) Active Problems:   HIV (human immunodeficiency virus infection) (HCC)   Acquired hypothyroidism   DM2 (diabetes mellitus, type 2) (HCC)   Atypical chest pain   HLD (hyperlipidemia)   Severe aortic stenosis        #) Acute diastolic heart failure: Potential new diagnosis, suspect a basis of 2 weeks of progressive shortness of breath, unintentional 15 pound weight gain, orthopnea, worsening edema in the bilateral lower extremities, with elevated proBNP, elevated presenting chest x-ray shows no evidence of acute cardiopulmonary process.  Potential new diagnosis in the setting of progression of previously noted severe aortic stenosis.  Received Lasix  in the ED.  His atypical chest pain is noted, nonexertional, and on responsive to nitroglycerin , with this chest pain, appearing consistent with associated progression of aortic stenosis.  Plan: Lasix  3 mg IV twice daily.  Echocardiogram the morning.  EDP is requesting cardiology consultation, as above.  Strict I's and O's and daily weights.  Potassium 34 mill [place] p.o. x 1 dose.  Add a magnesium level.  CMP in the morning.  As needed IV fentanyl  for atypical chest pain, spirometry.              #) Type 2 diabetes mellitus: On metformin.  Add on hemoglobin A1c level.  Hold metformin.  CBG monitoring q. ACHS with vital signs constant.                   # HIV: Documented history of such, Biktarvy .  Plan: Resume home Biktarvy .                  #) HLD high intensity rosuvastatin  as outpatient.  Resume home rosuvastatin .                   #) acquired hypothyroidism: Documented history of such, on Synthroid  as an outpatient.  Plan: Resume home Synthroid , check TSH.         DVT prophylaxis: SCD's   Code Status: Full  code Family Communication: none Disposition Plan: Per Rounding Team Consults called: none;  Admission status: obs     I SPENT GREATER THAN 75  MINUTES IN CLINICAL CARE TIME/MEDICAL DECISION-MAKING IN COMPLETING THIS ADMISSION.      Brin Ruggerio B Saddie Sandeen DO [facility]  [facility] -  7AM   04/20/2024, 2:25 AM         [1]  Allergies Allergen Reactions   Prednisone Itching    Itching, flushing   Sulfa Antibiotics Rash    Childhood > rash   "

## 2024-04-20 NOTE — ED Notes (Signed)
 CCMD called to have pt. Monitored

## 2024-04-20 NOTE — Progress Notes (Signed)
 Echocardiogram 2D Echocardiogram has been performed.  Juliene JINNY Rucks 04/20/2024, 4:30 PM

## 2024-04-20 NOTE — Consult Note (Addendum)
 "  Cardiology Consultation   Patient ID: Ethan Farrell MRN: [id number]; DOB: 1960-08-21  Admit date: 04/19/2024 Date of Consult: 04/20/2024  PCP:  Pia Kerney SQUIBB, MD   [facility] Providers Cardiologist:  Redell Shallow, MD        Patient Profile: Ethan Farrell is a 64 y.o. male with a hx of hypertension, CAD s/p PCI to RCA in 2011 & 2018, OSA on CPAP, T2DM, hypothyroidism, HIV, and tobacco use who is being seen 04/20/2024 for the evaluation of heart failure at the request of Harlene Bowl DO.  History of Present Illness: Mr. Poke has known CAD s/p DES to RCA in 2011 and 2018 at OSH. He established with [facility] in 2023 for pre-operative risk evaluation in the setting of pending hiatal hernia surgery. At that time patient was doing well from a cardiac standpoint. He did follow up with our practice for intermittent episodes of dizziness, he was recommended to pursue a Lexiscan  07/2023 which showed no ischemia or infarction. Echo 07/2023 showed LVEF 60-65% with no RWMA. Mild LVH. Mild to moderately dilated LA. Bicuspid aortic valve with moderate to severe AS. Unfortunately was unable to calculate DVI, AVA, and LV stroke volume. VTI 0.75cm. Patient denied concerning symptoms for AS at that time.   Presented to the ED for intermittent chest pain with shortness of breath and emesis for several weeks.  In the ED:  BP: 185/69 ECG: possible atrial rhythm, ST changes in inferior leads. No reciprocal changes.  CXR WNL Echo pending  Pertinent lab work: K 3.2 ProBNP 470 Troponin negative TSH 11.1  He has received pain medication and one dose of IV lasix  40 mg. No UOP charted, reported good urine output. Notices improvement in his shortness of breath.  On interview, patient shared over the last month he started noticing weight gain, then about 2-3 weeks later started noticing progressive shortness of breath, chest pain, dizziness with exertion, and peripheral edema. He did call into  our line and picked up his SL NG and lasix , took one of each yesterday evening. Shared the SL NG did not help his chest pain.  Reports current chest pain, heaviness described as 6/10. Morphine  helped.  At thanksgiving patient weighed 211 lbs.  Past Medical History:  Diagnosis Date   Arthritis    Asthma    Cancer (HCC)    melenoma   Coronary artery disease    DM type 2 (diabetes mellitus, type 2) (HCC)    Heart attack (HCC) 11/2009   2 stents   Heart murmur    HIV infection (HCC)    undetectable   HTN (hypertension)    Hypercholesterolemia    Hypothyroid    Neuromuscular disorder (HCC)    neuropathy fingers   OSA (obstructive sleep apnea)    Pneumonia    Seasonal allergies     Past Surgical History:  Procedure Laterality Date   APPENDECTOMY     BACK SURGERY     CARPAL TUNNEL RELEASE Left    HIGH RESOLUTION ANOSCOPY N/A 09/08/2023   Procedure: ANOSCOPY, HIGH RESOLUTION WITH BIOPSY ABLATION;  Surgeon: Debby Hila, MD;  Location: [facility];  Service: General;  Laterality: N/A;  High resolution anoscopy with possible biopsy/ablation   KNEE SURGERY     left   right shoulder     right shoulder spurs   ROTATOR CUFF REPAIR     right   UMBILICAL HERNIA REPAIR     x2  Scheduled Meds:  aspirin  EC  81 mg Oral Daily   bictegravir-emtricitabine -tenofovir  AF  1 tablet Oral Daily   furosemide   20 mg Intravenous BID   insulin  aspart  0-9 Units Subcutaneous TID WC   levothyroxine   200 mcg Oral QAC breakfast   rosuvastatin   40 mg Oral Daily   Continuous Infusions:  PRN Meds: acetaminophen  **OR** acetaminophen , fentaNYL  (SUBLIMAZE ) injection, melatonin, ondansetron  (ZOFRAN ) IV  Allergies:   Allergies[1]  Social History:   Social History   Socioeconomic History   Marital status: Widowed    Spouse name: Not on file   Number of children: 2   Years of education: 14   Highest education level: Not on file  Occupational History   Occupation: production designer, theatre/television/film at [employer]   Tobacco Use   Smoking status: Every Day    Current packs/day: 1.00    Average packs/day: 1 pack/day for 25.0 years (25.0 ttl pk-yrs)    Types: Cigarettes   Smokeless tobacco: Never   Tobacco comments:    cut back from 2 packs to half pack/day  Vaping Use   Vaping status: Never Used  Substance and Sexual Activity   Alcohol use: No   Drug use: No   Sexual activity: Yes    Partners: Male    Comment: declined condoms  Other Topics Concern   Not on file  Social History Narrative   Not on file   Social Drivers of Health   Tobacco Use: High Risk (04/20/2024)   Patient History    Smoking Tobacco Use: Every Day    Smokeless Tobacco Use: Never    Passive Exposure: Not on file  Financial Resource Strain: Not on file  Food Insecurity: No Food Insecurity (04/20/2024)   Epic    Worried About Programme Researcher, Broadcasting/film/video in the Last Year: Never true    Ran Out of Food in the Last Year: Never true  Transportation Needs: No Transportation Needs (04/20/2024)   Epic    Lack of Transportation (Medical): No    Lack of Transportation (Non-Medical): No  Physical Activity: Not on file  Stress: Not on file  Social Connections: Moderately Integrated (04/20/2024)   Social Connection and Isolation Panel    Frequency of Communication with Friends and Family: More than three times a week    Frequency of Social Gatherings with Friends and Family: Once a week    Attends Religious Services: 1 to 4 times per year    Active Member of [employer] or Organizations: Yes    Attends Banker Meetings: Never    Marital Status: Widowed  Intimate Partner Violence: Not At Risk (04/20/2024)   Epic    Fear of Current or Ex-Partner: No    Emotionally Abused: No    Physically Abused: No    Sexually Abused: No  Depression (PHQ2-9): Low Risk (05/20/2023)   Depression (PHQ2-9)    PHQ-2 Score: 0  Alcohol Screen: Not on file  Housing: Low Risk (04/20/2024)   Epic    Unable to Pay for Housing in the Last Year: No     Number of Times Moved in the Last Year: 0    Homeless in the Last Year: No  Utilities: Not At Risk (04/20/2024)   Epic    Threatened with loss of utilities: No  Health Literacy: Not on file    Family History:   Family History  Problem Relation Age of Onset   Hypertension Father    Heart attack Paternal Grandfather  Heart failure Paternal Grandfather    Heart failure Paternal Grandmother    Heart attack Paternal Grandmother      ROS:  Please see the history of present illness.  All other ROS reviewed and negative.     Physical Exam/Data: Vitals:   04/20/24 1215 04/20/24 1230 04/20/24 1255 04/20/24 1323  BP: (!) 145/78 131/62    Pulse: 68 (!) 58 64   Resp: 18 (!) 25 18   Temp:    97.6 F (36.4 C)  TempSrc:    Oral  SpO2: 96% 93% 92%   Weight:      Height:       No intake or output data in the 24 hours ending 04/20/24 1401    04/19/2024   10:26 PM 04/19/2024   10:08 PM 01/24/2024    3:21 PM  Last 3 Weights  Weight (lbs) 231 lb 14.8 oz 232 lb 234 lb  Weight (kg) 105.2 kg 105.235 kg 106.142 kg     Body mass index is 30.6 kg/m.  General:  Well nourished, well developed, in no acute distress HEENT: normal Neck: JVD Vascular: No carotid bruits; Distal pulses 2+ bilaterally Cardiac:  normal S1, S2; RRR; 2-3/6 systolic murmur with radiation to carotids Lungs:  minimal crackles at bases Abd: soft, non-distended, tender to RUQ and hypogastric area Ext: no edema Skin: warm and dry  Psych:  Normal affect   EKG:  The EKG was personally reviewed and demonstrates:  See HPI Telemetry:  Telemetry was personally reviewed and demonstrates:  sinus rhythm HR ~60  Relevant CV Studies: Echocardiogram 07/2023 IMPRESSIONS     1. Left ventricular ejection fraction, by estimation, is 60 to 65%. The  left ventricle has normal function. The left ventricle has no regional  wall motion abnormalities. There is mild concentric left ventricular  hypertrophy. Left ventricular diastolic   parameters were normal. The average left ventricular global longitudinal  strain is -18.4 %. The global longitudinal strain is normal.   2. Right ventricular systolic function is normal. The right ventricular  size is normal. There is normal pulmonary artery systolic pressure.   3. Left atrial size was mild to moderately dilated.   4. The mitral valve is normal in structure. No evidence of mitral valve  regurgitation.   5. Bicuspid valve is suspected. Suboptimal LVOT Doppler precludes acurate  DVI, AVA, and LV Stroke volume index assessment. The aortic valve was not  well visualized. Aortic valve regurgitation is mild. Moderate to severe  aortic valve stenosis. Aortic  valve area, by VTI measures 0.75 cm. Aortic valve mean gradient measures  34.0 mmHg. Aortic valve Vmax measures 3.65 m/s. Aortic valve acceleration  time measures 106 msec.   6. The inferior vena cava is normal in size with greater than 50%  respiratory variability, suggesting right atrial pressure of 3 mmHg.   Lexiscan  07/2023   The study is normal. The study is low risk.   No ST deviation was noted.   LV perfusion is normal. There is no evidence of ischemia. There is no evidence of infarction.   LVEF could not be accurately reported. Refer to recent echocardiogram.   CT images were obtained for attenuation correction and were examined for the presence of coronary calcium  when appropriate.   Coronary calcium  assessment not performed due to prior revascularization. Severe aortic valve calcifications.   Prior study not available for comparison.    Laboratory Data: High Sensitivity Troponin:   Recent Labs  Lab 04/19/24 2225 04/20/24 0015  04/20/24 0902  TRNPT 17 17 14       Chemistry Recent Labs  Lab 04/19/24 2225 04/20/24 0444  NA 144 143  K 3.7 3.2*  CL 106 107  CO2 25 26  GLUCOSE 150* 83  BUN 16 14  CREATININE 0.99 0.85  CALCIUM  8.9 8.8*  MG  --  2.1  GFRNONAA >60 >60  ANIONGAP 13 11    Recent  Labs  Lab 04/20/24 0444  PROT 6.6  ALBUMIN 4.0  AST 24  ALT 21  ALKPHOS 79  BILITOT 0.3   Hematology Recent Labs  Lab 04/19/24 2225 04/20/24 0444  WBC 5.4 5.7  RBC 4.00* 3.73*  HGB 13.3 12.5*  HCT 39.9 36.6*  MCV 99.8 98.1  MCH 33.3 33.5  MCHC 33.3 34.2  RDW 13.0 12.9  PLT 148* 143*   Thyroid  Recent Labs  Lab 04/20/24 0444  TSH 11.100*    BNP Recent Labs  Lab 04/19/24 2225 04/20/24 0444  PROBNP 473.0* 470.0*     Radiology/Studies:  DG Chest 2 View Result Date: 04/19/2024 EXAM: 2 VIEW(S) XRAY OF THE CHEST 04/19/2024 10:20:00 PM COMPARISON: 08/07/2010, CT 06/01/2023. CLINICAL HISTORY: chest pain FINDINGS: LUNGS AND PLEURA: No focal pulmonary opacity. No pleural effusion. No pneumothorax. HEART AND MEDIASTINUM: No acute abnormality of the cardiac and mediastinal silhouettes. BONES AND SOFT TISSUES: No acute osseous abnormality. IMPRESSION: 1. No acute cardiopulmonary pathology. Electronically signed by: Franky Crease MD 04/19/2024 10:25 PM EST RP Workstation: [id number]     Assessment and Plan:  Aortic Stenosis  07/2023 echo showed moderate to severe AS etiology 2/2 bicuspid valve. Multiple metrics were unable to be calculated at that time due to suboptimal LVOT doppler.  Since ^^ obtained he reported progressive symptoms of chest pain, progressive shortness of breath, weight gain [~20lbs], and peripheral edema.  Pro-BNP WNL for his age ~33 On exam, appears volume up Repeat echo pending.   Will follow up on echocardiogram results, sounds like progression of his AS. Suspect he would be a good surgical candidate, most likely consult CTS this admission for definitive treatment. With his history of CAD and ongoing chest pain could consider obtaining RHC/LHC this admission for further evaluation as this would also begin work up though would pursue further diuresis for now before any procedures.   Continue IV lasix  20 mg, assess UOP. Strict I/O. Continue to replete K as  needed.   CAD s/p DES to RCA  Troponin negative this admission Currently experiencing chest pain 6/10. No improvement with SL NG, mild improvement with pain meds. Suspect 2/2 AS as above, though with his history will also repeat 12 lead and potentially pursue cath as above for further evaluation.   Continue ASA Patient not on BB, once compensated could consider starting  Hypertension BP: 147/60 Will need to be careful to not induce hypotension with AS.  Continue holding PTA lisinopril  for now  Hyperlipidemia 12/2023 LDL 63   HDL 40 Continue crestor  40 mg   OSA  Continue CPAP use  Per primary T2DM HIV Hypothyroidism [TSH 11]    Risk Assessment/Risk Scores:       [place]  Heart Association (NYHA) Functional Class NYHA Class IV    For questions or updates, please contact [facility] Please consult [email or web address] for contact info under      Signed, Leontine LOISE Salen, PA-C  04/20/2024 2:01 PM     [1]  Allergies Allergen Reactions   Prednisone Itching    Itching, flushing   Sulfa  Antibiotics Rash    Childhood > rash   "

## 2024-04-21 LAB — CBC
HCT: 38.2 % — ABNORMAL LOW (ref 39.0–52.0)
Hemoglobin: 13.2 g/dL (ref 13.0–17.0)
MCH: 33.5 pg (ref 26.0–34.0)
MCHC: 34.6 g/dL (ref 30.0–36.0)
MCV: 97 fL (ref 80.0–100.0)
Platelets: 145 K/uL — ABNORMAL LOW (ref 150–400)
RBC: 3.94 MIL/uL — ABNORMAL LOW (ref 4.22–5.81)
RDW: 12.8 % (ref 11.5–15.5)
WBC: 6.6 K/uL (ref 4.0–10.5)
nRBC: 0 % (ref 0.0–0.2)

## 2024-04-21 LAB — ECHOCARDIOGRAM COMPLETE
AR max vel: 0.91 cm2
AV Area VTI: 1.01 cm2
AV Area mean vel: 0.91 cm2
AV Mean grad: 32 mmHg
AV Peak grad: 55.8 mmHg
Ao pk vel: 3.74 m/s
Area-P 1/2: 3.48 cm2
Height: 73 in
P 1/2 time: 472 ms
S' Lateral: 3.8 cm
Weight: 3746.06 [oz_av]

## 2024-04-21 LAB — BASIC METABOLIC PANEL WITH GFR
Anion gap: 10 (ref 5–15)
BUN: 22 mg/dL (ref 8–23)
CO2: 25 mmol/L (ref 22–32)
Calcium: 9.2 mg/dL (ref 8.9–10.3)
Chloride: 106 mmol/L (ref 98–111)
Creatinine, Ser: 0.98 mg/dL (ref 0.61–1.24)
GFR, Estimated: >60 mL/min
Glucose, Bld: 152 mg/dL — ABNORMAL HIGH (ref 70–99)
Potassium: 4.2 mmol/L (ref 3.5–5.1)
Sodium: 142 mmol/L (ref 135–145)

## 2024-04-21 LAB — GLUCOSE, CAPILLARY
Glucose-Capillary: 104 mg/dL — ABNORMAL HIGH (ref 70–99)
Glucose-Capillary: 108 mg/dL — ABNORMAL HIGH (ref 70–99)

## 2024-04-21 MED ORDER — FUROSEMIDE 10 MG/ML IJ SOLN
20.0000 mg | Freq: Two times a day (BID) | INTRAMUSCULAR | Status: DC
  Administered 2024-04-21: 20 mg via INTRAVENOUS
  Filled 2024-04-21: qty 2

## 2024-04-21 MED ORDER — FUROSEMIDE 20 MG PO TABS: 20.0000 mg | ORAL_TABLET | Freq: Every day | ORAL | Status: AC

## 2024-04-21 NOTE — Progress Notes (Signed)
 Heart Failure Navigator Progress Note  Assessed for Heart & Vascular TOC clinic readiness.  Patient does not meet criteria due to scheduled [facility] appt for 1/28.   Navigator available for reassessment of patient.   Duwaine Plant, PharmD, BCPS Heart Failure Stewardship Pharmacist Phone [phone]

## 2024-04-21 NOTE — Progress Notes (Addendum)
 Discharge Nurse Summary: DC order noted per MD. DC RN at bedside with patient. Patient agreeable with discharge plan, states family will arrive soon for pickup. AVS printed/reviewed. PIV removed, skin intact. No DME needs. No home/TOC meds. CP/Edu resolved. Telemonitor returned to charging station. All belongings accounted for. Patient wheeled downstairs for discharge by private auto.   Rosario EMERSON Lund, RN

## 2024-04-21 NOTE — TOC Transition Note (Signed)
 Transition of Care [facility]) - Discharge Note   Patient Details  Name: Ethan Farrell MRN: [id number] Date of Birth: 04/30/1960  [facility] CM/SW Contact:  Waddell Barnie Rama, RN Phone Number: 04/21/2024, 11:58 AM   Clinical Narrative:    For dc today, has no needs. Has transport at dc.          Patient Goals and CMS Choice            Discharge Placement                       Discharge Plan and Services Additional resources added to the After Visit Summary for                                       Social Drivers of Health (SDOH) Interventions SDOH Screenings   Food Insecurity: No Food Insecurity (04/20/2024)  Housing: Low Risk (04/20/2024)  Transportation Needs: No Transportation Needs (04/20/2024)  Utilities: Not At Risk (04/20/2024)  Depression (PHQ2-9): Low Risk (05/20/2023)  Social Connections: Moderately Integrated (04/20/2024)  Tobacco Use: High Risk (04/20/2024)     Readmission Risk Interventions     No data to display

## 2024-04-21 NOTE — Plan of Care (Signed)
   Problem: Education: Goal: Knowledge of General Education information will improve Description: Including pain rating scale, medication(s)/side effects and non-pharmacologic comfort measures Outcome: Progressing   Problem: Health Behavior/Discharge Planning: Goal: Ability to manage health-related needs will improve Outcome: Progressing   Problem: Clinical Measurements: Goal: Cardiovascular complication will be avoided Outcome: Progressing

## 2024-04-21 NOTE — Progress Notes (Signed)
 "  Progress Note  Patient Name: Ethan Farrell Date of Encounter: 04/21/2024 [facility] Cardiologist: Redell Shallow, MD   Interval Summary   Shared he is feeling a lot better since admission and with lasix .  Chest pain now only 3-4/10 Shortness of breath greatly improved, no longer at rest.  As been able to ambulate without shortness of breath or worsening chest pain.   Shared he is anxious about getting home today with the pending weather and his mother who he cares for.  Vital Signs Vitals:   04/21/24 0403 04/21/24 0404 04/21/24 0405 04/21/24 0730  BP:  (!) 162/64  (!) 138/51  Pulse:  (!) 59  65  Resp: (!) 27 (!) 23 (!) 22 18  Temp:  97.8 F (36.6 C)  97.9 F (36.6 C)  TempSrc:  Oral  Oral  SpO2:  91%  92%  Weight:  104.7 kg    Height:        Intake/Output Summary (Last 24 hours) at 04/21/2024 1025 Last data filed at 04/21/2024 9057 Gross per 24 hour  Intake 834 ml  Output 3450 ml  Net -2616 ml      04/21/2024    4:04 AM 04/20/2024    2:08 PM 04/19/2024   10:26 PM  Last 3 Weights  Weight (lbs) 230 lb 12.8 oz 234 lb 2.1 oz 231 lb 14.8 oz  Weight (kg) 104.69 kg 106.2 kg 105.2 kg      Telemetry/ECG  Sinus rhythm HR 60 - Personally Reviewed  Physical Exam  GEN: No acute distress.   Neck: Mild JVD Cardiac: RRR, systolic murmur 3/6. DP pulses 2+ Respiratory: Clear to auscultation bilaterally. GI: Soft, nontender, non-distended  MS: No edema  Assessment & Plan  Ethan Farrell is a 64 y.o. male with a hx of hypertension, CAD s/p PCI to RCA in 2011 & 2018, OSA on CPAP, T2DM, hypothyroidism, HIV, and tobacco use who presented to the ED for intermittent chest pain with shortness of breath, emesis, lightheadedness/dizziness for several weeks   Aortic Stenosis  Echo this admission shows preserved EF with severe AS. Patient is symptomatic with chest pain, HF symptoms, and pre-syncopal symptoms of lightheadedness/dizziness.   Net IO Since Admission: -2,616 mL  [04/21/24 1118] On exam, appears almost euvolemic. Weight down 4lbs  As patient is reporting improvement in symptoms and would like to be discharged today given pending weather and his mother who he cares for, suspect it would be okay to dc today with oral lasix  and close cardiology follow-up. For definitive treatment suspect he would be a good surgical candidate and will get him scheduled with outpatient CTS. MD to make final decision.  Give additional IV lasix  20 mg this afternoon. Start oral lasix  20 mg tomorrow, an additional 20 mg for weight gain or peripheral edema Repeat BMET in one week.    CAD s/p DES to RCA  Troponin negative this admission Currently experiencing chest pain 6/10. No improvement with SL NG, mild improvement with pain meds. Suspect 2/2 AS as above, though with his history will also repeat 12 lead and potentially pursue cath as above for further evaluation.    Continue ASA With bradycardia defer BB   Hypertension BP: 138/51 Will need to be careful to not induce hypotension with AS.  Would not restart lisinopril  on discharge, would want to keep systolic 130-140 with AS.    Hyperlipidemia 12/2023 LDL 63   HDL 40 Continue crestor  40 mg    OSA  Continue CPAP use   Per primary T2DM HIV Hypothyroidism [TSH 11]    For questions or updates, please contact [facility] Please consult [email or web address] for contact info under       Signed, Leontine LOISE Salen, PA-C   "

## 2024-04-21 NOTE — Discharge Instructions (Addendum)
 Outpatient cardiology follow up as well as cardiovascular surgery Will need to be careful to not induce hypotension with AS.  Would not restart lisinopril  on discharge, would want to keep systolic 130-140 with AS.

## 2024-04-21 NOTE — Discharge Summary (Signed)
 "     Physician Discharge Summary  Ethan Farrell FMW:[id number] DOB: 1960/10/15 DOA: 04/19/2024  PCP: Pia Kerney SQUIBB, MD  Admit date: 04/19/2024 Discharge date: 04/21/2024  Admitted From:  Discharge disposition: home   Recommendations for Outpatient Follow-Up:   Patient to have close outpatient cardiology follow-up as well as CVTS for surgical planning New medication: Lasix  daily plus as needed Lasix  20 mg as needed   hold lisinopril  given AS. Resume for BP >150/100. Follow TSH outpatient-elevated in the hospital at 11   Discharge Diagnosis:   Principal Problem:   Acute diastolic heart failure (HCC) Active Problems:   HIV (human immunodeficiency virus infection) (HCC)   Acquired hypothyroidism   DM2 (diabetes mellitus, type 2) (HCC)   Atypical chest pain   HLD (hyperlipidemia)   Severe aortic stenosis   Acute on chronic diastolic CHF (congestive heart failure) (HCC)    Discharge Condition: Improved.  Diet recommendation: Low sodium, heart healthy Wound care: None.  Code status: Full.   History of Present Illness:   Ethan Farrell is a 64 y.o. male with medical history significant for severe aortic stenosis, who is admitted to [facility] on 04/19/2024 with concern for new diagnosis of acute diastolic heart failure after presenting from home to [facility] ED complaining of shortness of breath.    Patient with known history of severe aortic stenosis per most recent echocardiogram on 08/04/2023, he presents with 2 weeks of progressive shortness of breath associate with intermittent nonexertional chest pain, without improvement with nitroglycerin .  He notes associated 15 pound weight gain over the last few weeks, as well as worsening of peripheral edema.  No subjective fever, chills rigors, or generalized myalgias.  Started on Lasix  by his outpatient cardiologist, Dr. Conley but has noted no significant improvement in his presenting symptoms.   Hospital Course by  Problem:   Aortic valve stenosis Dizziness with exertion Chest pain Shortness of breath HFpEF, acute -Cardiology consult -Echocardiogram demonstrates severe aortic valve stenosis with a mean gradient of and a heavily calcified valve.  We will provide gentle diuresis as tolerated.  If we can optimize his symptoms we will consider surgical consultation though this may be able to be coordinated outpatient in close follow-up.  Overall he seems like a reasonable surgical candidate-follow-up has been arranged - given lasix  -- for d/c ok to give lasix  20 mg po daily with additional 20 mg po for swelling or CP/SOB.   CAD status post PCI -Troponins are normal this admission, chest pain unlikely to represent ACS -Suspect symptoms are related to severe aortic valve stenosis and heart failure -Continue ASA 81 mg daily   Hypertension -Mild permissive hypertension currently given aortic valve stenosis and heart failure, will revisit medical therapy when patient is optimized -  hold lisinopril  til close follow up given AS. Resume for BP >150/100.   Hyperlipidemia -Continue Crestor  40 mg daily   OSA -Continue CPAP use    Medical Consultants:   Cardiology   Discharge Exam:   Vitals:   04/21/24 0730 04/21/24 1120  BP: (!) 138/51 (!) 144/66  Pulse: 65 62  Resp: 18 (!) 22  Temp: 97.9 F (36.6 C) 97.9 F (36.6 C)  SpO2: 92% 95%   Vitals:   04/21/24 0404 04/21/24 0405 04/21/24 0730 04/21/24 1120  BP: (!) 162/64  (!) 138/51 (!) 144/66  Pulse: (!) 59  65 62  Resp: (!) 23 (!) 22 18 (!) 22  Temp: 97.8 F (36.6 C)  97.9  F (36.6 C) 97.9 F (36.6 C)  TempSrc: Oral  Oral Oral  SpO2: 91%  92% 95%  Weight: 104.7 kg     Height:        General exam: Appears calm and comfortable.    The results of significant diagnostics from this hospitalization (including imaging, microbiology, ancillary and laboratory) are listed below for reference.     Procedures and Diagnostic Studies:    ECHOCARDIOGRAM COMPLETE Result Date: 04/21/2024    ECHOCARDIOGRAM REPORT   Patient Name:   Ethan Farrell Date of Exam: 04/20/2024 Medical Rec #:  [id number]       Height:       73.0 in Accession #:    [id number]      Weight:       231.9 lb Date of Birth:  [birth year]      BSA:          2.291 m Patient Age:    63 years        BP:           147/60 mmHg Patient Gender: M               HR:           65 bpm. Exam Location:  Inpatient Procedure: 2D Echo, Cardiac Doppler and Color Doppler (Both Spectral and Color            Flow Doppler were utilized during procedure). Indications:    CHF - Acute Diastolic  History:        Patient has prior history of Echocardiogram examinations, most                 recent 08/04/2023. CHF, CAD and Previous Myocardial Infarction,                 Signs/Symptoms:Chest Pain and Shortness of Breath; Risk                 Factors:Diabetes, Dyslipidemia, Sleep Apnea, Hypertension and                 Current Smoker.  Sonographer:    Juliene Rucks Sonographer#2:  Carmelita Hartshorn RDCS, FE, PE Referring Phys: [id number] JUSTIN B HOWERTER IMPRESSIONS  1. Left ventricular ejection fraction, by estimation, is 60 to 65%. The left ventricle has normal function. The left ventricle has no regional wall motion abnormalities. Left ventricular diastolic parameters are consistent with Grade II diastolic dysfunction (pseudonormalization). Elevated left atrial pressure. The E/e' is 16.5.  2. Right ventricular systolic function is normal. The right ventricular size is normal.  3. The mitral valve is degenerative. No evidence of mitral valve regurgitation. No evidence of mitral stenosis.  4. Native aortic valve, leaflet calcification, moderate to severe AS (peak velocity 3.76m/s, MG , AVA VTI 0.84cm2, DI 0.27 (image 110 - pedoff) . Aortic valve regurgitation is trivial.  5. The inferior vena cava is normal in size with greater than 50% respiratory variability, suggesting right atrial pressure of 3 mmHg. FINDINGS   Left Ventricle: Left ventricular ejection fraction, by estimation, is 60 to 65%. The left ventricle has normal function. The left ventricle has no regional wall motion abnormalities. The left ventricular internal cavity size was normal in size. There is  borderline left ventricular hypertrophy. Left ventricular diastolic parameters are consistent with Grade II diastolic dysfunction (pseudonormalization). Elevated left atrial pressure. The E/e' is 16.5. Right Ventricle: The right ventricular size is normal. No increase in right ventricular wall thickness. Right ventricular systolic  function is normal. Left Atrium: Left atrial size was normal in size. Right Atrium: Right atrial size was normal in size. Pericardium: There is no evidence of pericardial effusion. Mitral Valve: The mitral valve is degenerative in appearance. Mild to moderate mitral annular calcification. No evidence of mitral valve regurgitation. No evidence of mitral valve stenosis. Tricuspid Valve: The tricuspid valve is grossly normal. Tricuspid valve regurgitation is not demonstrated. No evidence of tricuspid stenosis. Aortic Valve: Native aortic valve, leaflet calcification, moderate to severe AS (peak velocity 3.77m/s, MG , AVA VTI 0.84cm2, DI 0.27 (image 110 - pedoff). Aortic valve regurgitation is trivial. Aortic regurgitation PHT measures 472 msec. Aortic valve mean gradient measures 32.0 mmHg. Aortic valve peak gradient measures 55.8 mmHg. Aortic valve area, by VTI measures 1.01 cm. Pulmonic Valve: The pulmonic valve was not well visualized. Pulmonic valve regurgitation is not visualized. Aorta: The aortic root is normal in size and structure. Venous: The inferior vena cava is normal in size with greater than 50% respiratory variability, suggesting right atrial pressure of 3 mmHg. IAS/Shunts: The atrial septum is grossly normal.  LEFT VENTRICLE PLAX 2D LVIDd:         5.70 cm   Diastology LVIDs:         3.80 cm   LV e' medial:    6.22 cm/s  LV PW:         1.20 cm   LV E/e' medial:  16.6 LV IVS:        1.20 cm   LV e' lateral:   5.96 cm/s LVOT diam:     1.98 cm   LV E/e' lateral: 17.3 LV SV:         83 LV SV Index:   36 LVOT Area:     3.08 cm  RIGHT VENTRICLE RV Basal diam:  3.10 cm RV Mid diam:    2.80 cm RV S prime:     14.70 cm/s TAPSE (M-mode): 3.2 cm LEFT ATRIUM           Index        RIGHT ATRIUM           Index LA Vol (A4C): 53.2 ml 23.22 ml/m  RA Area:     14.30 cm                                    RA Volume:   35.00 ml  15.27 ml/m  AORTIC VALVE AV Area (Vmax):    0.91 cm AV Area (Vmean):   0.91 cm AV Area (VTI):     1.01 cm AV Vmax:           373.50 cm/s AV Vmean:          262.750 cm/s AV VTI:            0.815 m AV Peak Grad:      55.8 mmHg AV Mean Grad:      32.0 mmHg LVOT Vmax:         110.40 cm/s LVOT Vmean:        78.050 cm/s LVOT VTI:          0.268 m LVOT/AV VTI ratio: 0.33 AI PHT:            472 msec  AORTA Ao Root diam: 3.40 cm MITRAL VALVE MV Area (PHT): 3.48 cm     SHUNTS MV Decel Time: 218 msec  Systemic VTI:  0.27 m MV E velocity: 103.00 cm/s  Systemic Diam: 1.98 cm MV A velocity: 95.40 cm/s MV E/A ratio:  1.08 Sunit Tolia Electronically signed by Madonna Large Signature Date/Time: 04/21/2024/12:36:57 AM    Final    DG Chest 2 View Result Date: 04/19/2024 EXAM: 2 VIEW(S) XRAY OF THE CHEST 04/19/2024 10:20:00 PM COMPARISON: 08/07/2010, CT 06/01/2023. CLINICAL HISTORY: chest pain FINDINGS: LUNGS AND PLEURA: No focal pulmonary opacity. No pleural effusion. No pneumothorax. HEART AND MEDIASTINUM: No acute abnormality of the cardiac and mediastinal silhouettes. BONES AND SOFT TISSUES: No acute osseous abnormality. IMPRESSION: 1. No acute cardiopulmonary pathology. Electronically signed by: Franky Crease MD 04/19/2024 10:25 PM EST RP Workstation: [id number]     Labs:   Basic Metabolic Panel: Recent Labs  Lab 04/19/24 2225 04/20/24 0444 04/21/24 0806  NA 144 143 142  K 3.7 3.2* 4.2  CL 106 107 106  CO2 25 26 25    GLUCOSE 150* 83 152*  BUN 16 14 22   CREATININE 0.99 0.85 0.98  CALCIUM  8.9 8.8* 9.2  MG  --  2.1  --    GFR Estimated Creatinine Clearance: 95.5 mL/min (by C-G formula based on SCr of 0.98 mg/dL). Liver Function Tests: Recent Labs  Lab 04/20/24 0444  AST 24  ALT 21  ALKPHOS 79  BILITOT 0.3  PROT 6.6  ALBUMIN 4.0   No results for input(s): LIPASE, AMYLASE in the last 168 hours. No results for input(s): AMMONIA in the last 168 hours. Coagulation profile No results for input(s): INR, PROTIME in the last 168 hours.  CBC: Recent Labs  Lab 04/19/24 2225 04/20/24 0444 04/21/24 0806  WBC 5.4 5.7 6.6  NEUTROABS  --  2.9  --   HGB 13.3 12.5* 13.2  HCT 39.9 36.6* 38.2*  MCV 99.8 98.1 97.0  PLT 148* 143* 145*   Cardiac Enzymes: No results for input(s): CKTOTAL, CKMB, CKMBINDEX, TROPONINI in the last 168 hours. BNP: Invalid input(s): POCBNP CBG: Recent Labs  Lab 04/20/24 1235 04/20/24 1616 04/20/24 2200 04/21/24 0623 04/21/24 1121  GLUCAP 117* 126* 107* 104* 108*   D-Dimer No results for input(s): DDIMER in the last 72 hours. Hgb A1c Recent Labs    04/20/24 0444  HGBA1C 5.3   Lipid Profile No results for input(s): CHOL, HDL, LDLCALC, TRIG, CHOLHDL, LDLDIRECT in the last 72 hours. Thyroid function studies Recent Labs    04/20/24 0444  TSH 11.100*   Anemia work up No results for input(s): VITAMINB12, FOLATE, FERRITIN, TIBC, IRON, RETICCTPCT in the last 72 hours. Microbiology No results found for this or any previous visit (from the past 240 hours).   Discharge Instructions:   Discharge Instructions     Diet general   Complete by: As directed    Increase activity slowly   Complete by: As directed       Allergies as of 04/21/2024       Reactions   Prednisone Itching   Itching, flushing   Sulfa Antibiotics Rash   Childhood > rash        Medication List     STOP taking these medications     lisinopril  20 MG tablet Commonly known as: ZESTRIL        TAKE these medications    aspirin  EC 81 MG tablet Take 81 mg by mouth daily. Swallow whole.   Biktarvy  50-200-25 MG Tabs tablet Generic drug: bictegravir-emtricitabine -tenofovir  AF Take 1 tablet by mouth daily.   furosemide  20 MG tablet Commonly known as: LASIX  Take  1 tablet (20 mg total) by mouth daily. additional 20 mg for weight gain or peripheral edema Start taking on: April 22, 2024 What changed: additional instructions   levothyroxine  200 MCG tablet Commonly known as: SYNTHROID  Take 200 mcg by mouth daily before breakfast.   metFORMIN 1000 MG tablet Commonly known as: GLUCOPHAGE Take 1,000 mg by mouth daily.   Nicoderm CQ 21 MG/24HR patch Generic drug: nicotine 1 patch to skin Transdermal Once a day; Duration: 42 days   nitroGLYCERIN  0.4 MG SL tablet Commonly known as: NITROSTAT  Place 1 tablet (0.4 mg total) under the tongue every 5 (five) minutes as needed. What changed: reasons to take this   NON FORMULARY Pt uses a c-pap nightly   rosuvastatin  40 MG tablet Commonly known as: CRESTOR  Take 40 mg by mouth daily.   tamsulosin 0.4 MG Caps capsule Commonly known as: FLOMAX Take 0.4 mg by mouth daily.   tirzepatide 15 MG/0.5ML Pen Commonly known as: MOUNJARO Inject 15 mg into the skin once a week.   traMADol  50 MG tablet Commonly known as: ULTRAM  Take 1-2 tablets (50-100 mg total) by mouth every 6 (six) hours as needed. What changed: reasons to take this        Follow-up Information     Pia Kerney SQUIBB, MD Follow up in 1 week(s).   Specialty: Internal Medicine Why: BMP Contact information: [address], [address] [place] [address] [phone]                  Time coordinating discharge: 45 min  Signed:  Harlene RAYMOND Bowl DO  [facility] 04/21/2024, 11:56 AM      "

## 2024-04-21 NOTE — TOC CM/SW Note (Signed)
 Transition of Care [facility]) - Inpatient Brief Assessment   Patient Details  Name: Ethan Farrell MRN: [id number] Date of Birth: September 03, 1960  Transition of Care [facility]) CM/SW Contact:    Luise JAYSON Pan, LCSWA Phone Number: 04/21/2024, 11:14 AM   Clinical Narrative: Patient is from home, has PCP and insurance on file. Patient uses Development worker, community in [place]. Patient has no O2 needs at this time. Patient to have potential surgery per daily meeting with treatment team.  Inpatient Care Management will continue to monitor through daily progression meetings.   Transition of Care Asessment: Insurance and Status: Insurance coverage has been reviewed Patient has primary care physician: Yes Home environment has been reviewed: From home Prior level of function:: Independent Prior/Current Home Services: No current home services Social Drivers of Health Review: SDOH reviewed no interventions necessary Readmission risk has been reviewed: Yes Transition of care needs: no transition of care needs at this time

## 2024-04-24 NOTE — H&P (View-Only) (Signed)
 "    HPI: FU CAD and aortic stenosis.  Patient had PCI of the right coronary artery in 2011. Cardiac catheterization was performed August 2018 and patient was found to have severe stenosis in the proximal right coronary artery (80%), patent stents in the mid right coronary artery and otherwise nonobstructive coronary disease; ejection fraction 55%. Patient had PCI of the right coronary artery with a drug-eluting stent.  Carotid Dopplers September 2023 showed no hemodynamically significant stenosis.  Nuclear study May 2025 showed no ischemia or infarction.  Admitted with chest pain and dyspnea January 2026 and troponins were normal.  TSH 11.10.  Echocardiogram January 2026 showed normal LV function, grade 2 diastolic dysfunction, severe aortic stenosis with mean gradient 40 mmHg, dimensionless index 0.27 and aortic valve area 0.4 cm, trace aortic insufficiency.  Since last seen, patient states that his pain in his chest was in the left chest area and described as a pressure.  At that time of admission it had been persistent and lasted a total of 4 days continuously.  He has had occasional chest pain since then.  He notes increased dyspnea on exertion.  He denies syncope.  Current Outpatient Medications  Medication Sig Dispense Refill   aspirin  EC 81 MG tablet Take 81 mg by mouth daily. Swallow whole.     bictegravir-emtricitabine -tenofovir  AF (BIKTARVY ) 50-200-25 MG TABS tablet Take 1 tablet by mouth daily. 30 tablet 6   furosemide  (LASIX ) 20 MG tablet Take 1 tablet (20 mg total) by mouth daily. additional 20 mg for weight gain or peripheral edema     levothyroxine  (SYNTHROID ) 200 MCG tablet Take 200 mcg by mouth daily before breakfast.     metFORMIN (GLUCOPHAGE) 1000 MG tablet Take 1,000 mg by mouth daily.     NICODERM CQ 21 MG/24HR patch 1 patch to skin Transdermal Once a day; Duration: 42 days     nitroGLYCERIN  (NITROSTAT ) 0.4 MG SL tablet Place 1 tablet (0.4 mg total) under the tongue every 5 (five)  minutes as needed. (Patient taking differently: Place 0.4 mg under the tongue every 5 (five) minutes as needed for chest pain.) 30 tablet 1   NON FORMULARY Pt uses a c-pap nightly     rosuvastatin  (CRESTOR ) 40 MG tablet Take 40 mg by mouth daily.     tamsulosin (FLOMAX) 0.4 MG CAPS capsule Take 0.4 mg by mouth daily.     tirzepatide (MOUNJARO) 15 MG/0.5ML Pen Inject 15 mg into the skin once a week.     traMADol  (ULTRAM ) 50 MG tablet Take 1-2 tablets (50-100 mg total) by mouth every 6 (six) hours as needed. (Patient taking differently: Take 50-100 mg by mouth every 6 (six) hours as needed for moderate pain (pain score 4-6).) 20 tablet 0   No current facility-administered medications for this visit.     Past Medical History:  Diagnosis Date   Arthritis    Asthma    Cancer (HCC)    melenoma   Coronary artery disease    DM type 2 (diabetes mellitus, type 2) (HCC)    Heart attack (HCC) 11/2009   2 stents   Heart murmur    HIV infection (HCC)    undetectable   HTN (hypertension)    Hypercholesterolemia    Hypothyroid    Neuromuscular disorder (HCC)    neuropathy fingers   OSA (obstructive sleep apnea)    Pneumonia    Seasonal allergies     Past Surgical History:  Procedure Laterality Date   APPENDECTOMY  BACK SURGERY     CARPAL TUNNEL RELEASE Left    HIGH RESOLUTION ANOSCOPY N/A 09/08/2023   Procedure: ANOSCOPY, HIGH RESOLUTION WITH BIOPSY ABLATION;  Surgeon: Debby Hila, MD;  Location: [facility];  Service: General;  Laterality: N/A;  High resolution anoscopy with possible biopsy/ablation   KNEE SURGERY     left   right shoulder     right shoulder spurs   ROTATOR CUFF REPAIR     right   UMBILICAL HERNIA REPAIR     x2    Social History   Socioeconomic History   Marital status: Widowed    Spouse name: Not on file   Number of children: 2   Years of education: 14   Highest education level: Not on file  Occupational History   Occupation: production designer, theatre/television/film at [employer]   Tobacco Use   Smoking status: Every Day    Current packs/day: 1.00    Average packs/day: 1 pack/day for 25.0 years (25.0 ttl pk-yrs)    Types: Cigarettes   Smokeless tobacco: Never   Tobacco comments:    cut back from 2 packs to half pack/day  Vaping Use   Vaping status: Never Used  Substance and Sexual Activity   Alcohol use: No   Drug use: No   Sexual activity: Yes    Partners: Male    Comment: declined condoms  Other Topics Concern   Not on file  Social History Narrative   Not on file   Social Drivers of Health   Tobacco Use: High Risk (04/26/2024)   Patient History    Smoking Tobacco Use: Every Day    Smokeless Tobacco Use: Never    Passive Exposure: Not on file  Financial Resource Strain: Not on file  Food Insecurity: No Food Insecurity (04/20/2024)   Epic    Worried About Programme Researcher, Broadcasting/film/video in the Last Year: Never true    Ran Out of Food in the Last Year: Never true  Transportation Needs: No Transportation Needs (04/20/2024)   Epic    Lack of Transportation (Medical): No    Lack of Transportation (Non-Medical): No  Physical Activity: Not on file  Stress: Not on file  Social Connections: Moderately Integrated (04/20/2024)   Social Connection and Isolation Panel    Frequency of Communication with Friends and Family: More than three times a week    Frequency of Social Gatherings with Friends and Family: Once a week    Attends Religious Services: 1 to 4 times per year    Active Member of [employer] or Organizations: Yes    Attends Banker Meetings: Never    Marital Status: Widowed  Intimate Partner Violence: Not At Risk (04/20/2024)   Epic    Fear of Current or Ex-Partner: No    Emotionally Abused: No    Physically Abused: No    Sexually Abused: No  Depression (PHQ2-9): Low Risk (05/20/2023)   Depression (PHQ2-9)    PHQ-2 Score: 0  Alcohol Screen: Not on file  Housing: Low Risk (04/20/2024)   Epic    Unable to Pay for Housing in the Last Year: No     Number of Times Moved in the Last Year: 0    Homeless in the Last Year: No  Utilities: Not At Risk (04/20/2024)   Epic    Threatened with loss of utilities: No  Health Literacy: Not on file    Family History  Problem Relation Age of Onset   Hypertension Father  Heart attack Paternal Grandfather    Heart failure Paternal Grandfather    Heart failure Paternal Grandmother    Heart attack Paternal Grandmother     ROS: no fevers or chills, productive cough, hemoptysis, dysphasia, odynophagia, melena, hematochezia, dysuria, hematuria, rash, seizure activity, orthopnea, PND, pedal edema, claudication. Remaining systems are negative.  Physical Exam: Well-developed well-nourished in no acute distress.  Skin is warm and dry.  HEENT is normal.  Neck is supple.  Chest is clear to auscultation with normal expansion.  Cardiovascular exam is regular rate and rhythm.  3/6 systolic murmur left sternal border radiating to the carotids. Abdominal exam nontender or distended. No masses palpated. Extremities show no edema. neuro grossly intact  EKG Interpretation Date/Time:  Wednesday April 26 2024 12:00:05 EST Ventricular Rate:  80 PR Interval:  186 QRS Duration:  112 QT Interval:  402 QTC Calculation: 463 R Axis:   -17  Text Interpretation: Normal sinus rhythm Minimal voltage criteria for LVH, may be normal variant ( Cornell product ) Cannot rule out Septal infarct Non-specific ST-t changes Confirmed by Pietro Rogue ([id number]) on 04/26/2024 12:01:01 PM    A/P  1 aortic stenosis-severe on most recent echocardiogram.  He has chest pain with atypical features (continuous for 3 to 4 days without completely resolving and negative troponins during recent hospitalization).  However he also has increased dyspnea on exertion.  Will arrange right and left cardiac catheterization.  The risk and benefits including myocardial infarction, CVA and death discussed and he agrees to proceed.  Will then  arrange evaluation with cardiothoracic surgery for consideration of aortic valve replacement.  2 coronary artery disease-continue aspirin  and statin.  3 hypertension-blood pressure controlled.  Continue present medications.  4 hyperlipidemia-continue statin.  5 obstructive sleep apnea-continue CPAP.  Rogue Pietro, MD    "

## 2024-04-24 NOTE — Progress Notes (Unsigned)
 "    HPI: FU CAD and aortic stenosis.  Patient had PCI of the right coronary artery in 2011. Cardiac catheterization was performed August 2018 and patient was found to have severe stenosis in the proximal right coronary artery (80%), patent stents in the mid right coronary artery and otherwise nonobstructive coronary disease; ejection fraction 55%. Patient had PCI of the right coronary artery with a drug-eluting stent.  Carotid Dopplers September 2023 showed no hemodynamically significant stenosis.  Nuclear study May 2025 showed no ischemia or infarction.  Admitted with chest pain and dyspnea January 2026 and troponins were normal.  TSH 11.10.  Echocardiogram January 2026 showed normal LV function, grade 2 diastolic dysfunction, severe aortic stenosis with mean gradient 40 mmHg, dimensionless index 0.27 and aortic valve area 0.4 cm, trace aortic insufficiency.  Since last seen, patient states that his pain in his chest was in the left chest area and described as a pressure.  At that time of admission it had been persistent and lasted a total of 4 days continuously.  He has had occasional chest pain since then.  He notes increased dyspnea on exertion.  He denies syncope.  Current Outpatient Medications  Medication Sig Dispense Refill   aspirin  EC 81 MG tablet Take 81 mg by mouth daily. Swallow whole.     bictegravir-emtricitabine -tenofovir  AF (BIKTARVY ) 50-200-25 MG TABS tablet Take 1 tablet by mouth daily. 30 tablet 6   furosemide  (LASIX ) 20 MG tablet Take 1 tablet (20 mg total) by mouth daily. additional 20 mg for weight gain or peripheral edema     levothyroxine  (SYNTHROID ) 200 MCG tablet Take 200 mcg by mouth daily before breakfast.     metFORMIN (GLUCOPHAGE) 1000 MG tablet Take 1,000 mg by mouth daily.     NICODERM CQ 21 MG/24HR patch 1 patch to skin Transdermal Once a day; Duration: 42 days     nitroGLYCERIN  (NITROSTAT ) 0.4 MG SL tablet Place 1 tablet (0.4 mg total) under the tongue every 5 (five)  minutes as needed. (Patient taking differently: Place 0.4 mg under the tongue every 5 (five) minutes as needed for chest pain.) 30 tablet 1   NON FORMULARY Pt uses a c-pap nightly     rosuvastatin  (CRESTOR ) 40 MG tablet Take 40 mg by mouth daily.     tamsulosin (FLOMAX) 0.4 MG CAPS capsule Take 0.4 mg by mouth daily.     tirzepatide (MOUNJARO) 15 MG/0.5ML Pen Inject 15 mg into the skin once a week.     traMADol  (ULTRAM ) 50 MG tablet Take 1-2 tablets (50-100 mg total) by mouth every 6 (six) hours as needed. (Patient taking differently: Take 50-100 mg by mouth every 6 (six) hours as needed for moderate pain (pain score 4-6).) 20 tablet 0   No current facility-administered medications for this visit.     Past Medical History:  Diagnosis Date   Arthritis    Asthma    Cancer (HCC)    melenoma   Coronary artery disease    DM type 2 (diabetes mellitus, type 2) (HCC)    Heart attack (HCC) 11/2009   2 stents   Heart murmur    HIV infection (HCC)    undetectable   HTN (hypertension)    Hypercholesterolemia    Hypothyroid    Neuromuscular disorder (HCC)    neuropathy fingers   OSA (obstructive sleep apnea)    Pneumonia    Seasonal allergies     Past Surgical History:  Procedure Laterality Date   APPENDECTOMY  BACK SURGERY     CARPAL TUNNEL RELEASE Left    HIGH RESOLUTION ANOSCOPY N/A 09/08/2023   Procedure: ANOSCOPY, HIGH RESOLUTION WITH BIOPSY ABLATION;  Surgeon: Debby Hila, MD;  Location: [facility];  Service: General;  Laterality: N/A;  High resolution anoscopy with possible biopsy/ablation   KNEE SURGERY     left   right shoulder     right shoulder spurs   ROTATOR CUFF REPAIR     right   UMBILICAL HERNIA REPAIR     x2    Social History   Socioeconomic History   Marital status: Widowed    Spouse name: Not on file   Number of children: 2   Years of education: 14   Highest education level: Not on file  Occupational History   Occupation: production designer, theatre/television/film at [employer]   Tobacco Use   Smoking status: Every Day    Current packs/day: 1.00    Average packs/day: 1 pack/day for 25.0 years (25.0 ttl pk-yrs)    Types: Cigarettes   Smokeless tobacco: Never   Tobacco comments:    cut back from 2 packs to half pack/day  Vaping Use   Vaping status: Never Used  Substance and Sexual Activity   Alcohol use: No   Drug use: No   Sexual activity: Yes    Partners: Male    Comment: declined condoms  Other Topics Concern   Not on file  Social History Narrative   Not on file   Social Drivers of Health   Tobacco Use: High Risk (04/26/2024)   Patient History    Smoking Tobacco Use: Every Day    Smokeless Tobacco Use: Never    Passive Exposure: Not on file  Financial Resource Strain: Not on file  Food Insecurity: No Food Insecurity (04/20/2024)   Epic    Worried About Programme Researcher, Broadcasting/film/video in the Last Year: Never true    Ran Out of Food in the Last Year: Never true  Transportation Needs: No Transportation Needs (04/20/2024)   Epic    Lack of Transportation (Medical): No    Lack of Transportation (Non-Medical): No  Physical Activity: Not on file  Stress: Not on file  Social Connections: Moderately Integrated (04/20/2024)   Social Connection and Isolation Panel    Frequency of Communication with Friends and Family: More than three times a week    Frequency of Social Gatherings with Friends and Family: Once a week    Attends Religious Services: 1 to 4 times per year    Active Member of [employer] or Organizations: Yes    Attends Banker Meetings: Never    Marital Status: Widowed  Intimate Partner Violence: Not At Risk (04/20/2024)   Epic    Fear of Current or Ex-Partner: No    Emotionally Abused: No    Physically Abused: No    Sexually Abused: No  Depression (PHQ2-9): Low Risk (05/20/2023)   Depression (PHQ2-9)    PHQ-2 Score: 0  Alcohol Screen: Not on file  Housing: Low Risk (04/20/2024)   Epic    Unable to Pay for Housing in the Last Year: No     Number of Times Moved in the Last Year: 0    Homeless in the Last Year: No  Utilities: Not At Risk (04/20/2024)   Epic    Threatened with loss of utilities: No  Health Literacy: Not on file    Family History  Problem Relation Age of Onset   Hypertension Father  Heart attack Paternal Grandfather    Heart failure Paternal Grandfather    Heart failure Paternal Grandmother    Heart attack Paternal Grandmother     ROS: no fevers or chills, productive cough, hemoptysis, dysphasia, odynophagia, melena, hematochezia, dysuria, hematuria, rash, seizure activity, orthopnea, PND, pedal edema, claudication. Remaining systems are negative.  Physical Exam: Well-developed well-nourished in no acute distress.  Skin is warm and dry.  HEENT is normal.  Neck is supple.  Chest is clear to auscultation with normal expansion.  Cardiovascular exam is regular rate and rhythm.  3/6 systolic murmur left sternal border radiating to the carotids. Abdominal exam nontender or distended. No masses palpated. Extremities show no edema. neuro grossly intact  EKG Interpretation Date/Time:  Wednesday April 26 2024 12:00:05 EST Ventricular Rate:  80 PR Interval:  186 QRS Duration:  112 QT Interval:  402 QTC Calculation: 463 R Axis:   -17  Text Interpretation: Normal sinus rhythm Minimal voltage criteria for LVH, may be normal variant ( Cornell product ) Cannot rule out Septal infarct Non-specific ST-t changes Confirmed by Pietro Rogue ([id number]) on 04/26/2024 12:01:01 PM    A/P  1 aortic stenosis-severe on most recent echocardiogram.  He has chest pain with atypical features (continuous for 3 to 4 days without completely resolving and negative troponins during recent hospitalization).  However he also has increased dyspnea on exertion.  Will arrange right and left cardiac catheterization.  The risk and benefits including myocardial infarction, CVA and death discussed and he agrees to proceed.  Will then  arrange evaluation with cardiothoracic surgery for consideration of aortic valve replacement.  2 coronary artery disease-continue aspirin  and statin.  3 hypertension-blood pressure controlled.  Continue present medications.  4 hyperlipidemia-continue statin.  5 obstructive sleep apnea-continue CPAP.  Rogue Pietro, MD    "

## 2024-04-26 ENCOUNTER — Other Ambulatory Visit: Payer: Self-pay | Admitting: *Deleted

## 2024-04-26 ENCOUNTER — Encounter: Payer: Self-pay | Admitting: Cardiology

## 2024-04-26 ENCOUNTER — Ambulatory Visit: Admitting: Cardiology

## 2024-04-26 VITALS — BP 120/56 | HR 83 | Ht 72.0 in | Wt 233.0 lb

## 2024-04-26 DIAGNOSIS — E782 Mixed hyperlipidemia: Secondary | ICD-10-CM

## 2024-04-26 DIAGNOSIS — R079 Chest pain, unspecified: Secondary | ICD-10-CM

## 2024-04-26 DIAGNOSIS — I10 Essential (primary) hypertension: Secondary | ICD-10-CM

## 2024-04-26 DIAGNOSIS — I2510 Atherosclerotic heart disease of native coronary artery without angina pectoris: Secondary | ICD-10-CM | POA: Diagnosis not present

## 2024-04-26 DIAGNOSIS — I350 Nonrheumatic aortic (valve) stenosis: Secondary | ICD-10-CM | POA: Diagnosis not present

## 2024-04-26 NOTE — Patient Instructions (Signed)
"   ° ° ° °  Cardiac Catheterization   You are scheduled for a Cardiac Catheterization on Friday, February 6 with Dr. Lonni Cash.  1. Please arrive at the [facility] (Main Entrance A) at [facility]: [address] [place], [place] [address] at 5:30 AM (This time is 2 hour(s) before your procedure to ensure your preparation).   Free valet parking service is available. You will check in at ADMITTING. The support person will be asked to wait in the waiting room.  It is OK to have someone drop you off and come back when you are ready to be discharged.        Special note: Every effort is made to have your procedure done on time. Please understand that emergencies sometimes delay scheduled procedures.  2. Diet: Nothing to eat after midnight.  3. Hydration:You need to be well hydrated before your procedure. On February 6, you may drink approved liquids (see below) until 2 hours before the procedure, with 16 oz of water as your last intake.   List of approved liquids water, clear juice, clear tea, black coffee, fruit juices, non-citric and without pulp, carbonated beverages, Gatorade, Kool -Aid, plain Jello-O and plain ice popsicles.  4. Labs: NONE NEEDED  5. Medication instructions in preparation for your procedure:  DO NOT TAKE METFORMIN 2/6-2/7-2/8- RESTART 2/9  DO NOT TAKE MOUNJARO PRIOR TO PROCEDURE  DO NOT TAKE FUROSEMIDE  THE MORNING OF THE PROCEDURE   On the morning of your procedure, take Aspirin  81 mg and any morning medicines NOT listed above.  You may use sips of water.  6. Plan to go home the same day, you will only stay overnight if medically necessary. 7. You MUST have a responsible adult to drive you home. 8. An adult MUST be with you the first 24 hours after you arrive home. 9. Bring a current list of your medications, and the last time and date medication taken. 10. Bring ID and current insurance cards. 11.Please wear clothes that are easy to get on and  off and wear slip-on shoes.  Thank you for allowing us  to care for you!   -- [facility]  Your physician recommends that you schedule a follow-up appointment in: 8-12 WEEKS   "

## 2024-05-03 ENCOUNTER — Telehealth: Payer: Self-pay | Admitting: *Deleted

## 2024-05-03 NOTE — Telephone Encounter (Signed)
 Cardiac Catheterization scheduled at [facility] for: Friday May 05, 2024 7:30 AM Arrival time [facility] Main Entrance A at: 5:30 AM  Diet: -Nothing to eat after midnight.  Hydration: -May drink clear liquids until 2 hours before the procedure.  Approved liquids: Water , clear tea, black coffee, fruit juices-non-citric and without pulp,Gatorade, plain Jello/popsicles.   -Please drink 16 oz of water  2 hours before procedure.  Medication instructions: -Hold:  Lasix -AM of procedure   Metformin-day of procedure and 48 hours after procedure -Other usual morning medications can be taken including aspirin  81 mg.  Mounjaro-weekly on Saturdays  Plan to go home the same day, you will only stay overnight if medically necessary.  You must have responsible adult to drive you home.  Someone must be with you the first 24 hours after you arrive home.  Reviewed procedure instructions with patient.

## 2024-05-05 ENCOUNTER — Encounter (HOSPITAL_COMMUNITY)
Admission: RE | Disposition: A | Discharge status: Home / Self Care | Payer: Self-pay | Source: Home / Self Care | Attending: Cardiovascular Disease

## 2024-05-05 ENCOUNTER — Ambulatory Visit (HOSPITAL_COMMUNITY)
Admission: RE | Admit: 2024-05-05 | Discharge: 2024-05-05 | Disposition: A | Discharge status: Home / Self Care | Source: Home / Self Care | Attending: Cardiovascular Disease | Admitting: Cardiovascular Disease

## 2024-05-05 ENCOUNTER — Other Ambulatory Visit: Payer: Self-pay

## 2024-05-05 DIAGNOSIS — I350 Nonrheumatic aortic (valve) stenosis: Secondary | ICD-10-CM

## 2024-05-05 LAB — POCT I-STAT 7, (LYTES, BLD GAS, ICA,H+H)
Acid-base deficit: 1 mmol/L (ref 0.0–2.0)
Bicarbonate: 24 mmol/L (ref 20.0–28.0)
Calcium, Ion: 1.12 mmol/L — ABNORMAL LOW (ref 1.15–1.40)
HCT: 37 % — ABNORMAL LOW (ref 39.0–52.0)
Hemoglobin: 12.6 g/dL — ABNORMAL LOW (ref 13.0–17.0)
O2 Saturation: 94 %
Potassium: 3.4 mmol/L — ABNORMAL LOW (ref 3.5–5.1)
Sodium: 146 mmol/L — ABNORMAL HIGH (ref 135–145)
TCO2: 25 mmol/L (ref 22–32)
pCO2 arterial: 40.6 mmHg (ref 32–48)
pH, Arterial: 7.379 (ref 7.35–7.45)
pO2, Arterial: 70 mmHg — ABNORMAL LOW (ref 83–108)

## 2024-05-05 LAB — POCT I-STAT EG7
Acid-base deficit: 1 mmol/L (ref 0.0–2.0)
Bicarbonate: 24.8 mmol/L (ref 20.0–28.0)
Calcium, Ion: 1.14 mmol/L — ABNORMAL LOW (ref 1.15–1.40)
HCT: 37 % — ABNORMAL LOW (ref 39.0–52.0)
Hemoglobin: 12.6 g/dL — ABNORMAL LOW (ref 13.0–17.0)
O2 Saturation: 69 %
Potassium: 3.5 mmol/L (ref 3.5–5.1)
Sodium: 144 mmol/L (ref 135–145)
TCO2: 26 mmol/L (ref 22–32)
pCO2, Ven: 43.6 mmHg — ABNORMAL LOW (ref 44–60)
pH, Ven: 7.362 (ref 7.25–7.43)
pO2, Ven: 38 mmHg (ref 32–45)

## 2024-05-05 LAB — GLUCOSE, CAPILLARY: Glucose-Capillary: 96 mg/dL (ref 70–99)

## 2024-05-05 MED ORDER — FENTANYL CITRATE (PF) 100 MCG/2ML IJ SOLN
INTRAMUSCULAR | Status: AC
  Filled 2024-05-05: qty 2

## 2024-05-05 MED ORDER — ACETAMINOPHEN 325 MG PO TABS: 650.0000 mg | ORAL_TABLET | ORAL | Status: DC | PRN

## 2024-05-05 MED ORDER — SODIUM CHLORIDE 0.9% FLUSH: 3.0000 mL | INTRAVENOUS | Status: DC | PRN

## 2024-05-05 MED ORDER — SODIUM CHLORIDE 0.9 % IV SOLN: 250.0000 mL | INTRAVENOUS | Status: DC | PRN

## 2024-05-05 MED ORDER — MIDAZOLAM HCL (PF) 2 MG/2ML IJ SOLN
INTRAMUSCULAR | Status: DC | PRN
  Administered 2024-05-05: 2 mg via INTRAVENOUS

## 2024-05-05 MED ORDER — ONDANSETRON HCL 4 MG/2ML IJ SOLN: 4.0000 mg | Freq: Four times a day (QID) | INTRAMUSCULAR | Status: DC | PRN

## 2024-05-05 MED ORDER — ASPIRIN 81 MG PO CHEW: 81.0000 mg | CHEWABLE_TABLET | ORAL | Status: DC

## 2024-05-05 MED ORDER — MIDAZOLAM HCL 2 MG/2ML IJ SOLN
INTRAMUSCULAR | Status: AC
  Filled 2024-05-05: qty 2

## 2024-05-05 MED ORDER — IOHEXOL 350 MG/ML SOLN
INTRAVENOUS | Status: DC | PRN
  Administered 2024-05-05: 50 mL

## 2024-05-05 MED ORDER — LABETALOL HCL 5 MG/ML IV SOLN: 10.0000 mg | INTRAVENOUS | Status: DC | PRN

## 2024-05-05 MED ORDER — FENTANYL CITRATE (PF) 100 MCG/2ML IJ SOLN
INTRAMUSCULAR | Status: DC | PRN
  Administered 2024-05-05: 50 ug via INTRAVENOUS

## 2024-05-05 MED ORDER — HEPARIN SODIUM (PORCINE) 1000 UNIT/ML IJ SOLN
INTRAMUSCULAR | Status: AC
  Filled 2024-05-05: qty 10

## 2024-05-05 MED ORDER — LIDOCAINE HCL (PF) 1 % IJ SOLN
INTRAMUSCULAR | Status: AC
  Filled 2024-05-05: qty 30

## 2024-05-05 MED ORDER — HYDRALAZINE HCL 20 MG/ML IJ SOLN: 10.0000 mg | INTRAMUSCULAR | Status: DC | PRN

## 2024-05-05 MED ORDER — FREE WATER: 500.0000 mL | Freq: Once | Status: DC

## 2024-05-05 MED ORDER — SODIUM CHLORIDE 0.9% FLUSH: 3.0000 mL | Freq: Two times a day (BID) | INTRAVENOUS | Status: DC

## 2024-05-05 MED ORDER — HEPARIN (PORCINE) IN NACL 1000-0.9 UT/500ML-% IV SOLN
INTRAVENOUS | Status: DC | PRN
  Administered 2024-05-05 (×2): 500 mL

## 2024-05-05 MED ORDER — HEPARIN SODIUM (PORCINE) 1000 UNIT/ML IJ SOLN
INTRAMUSCULAR | Status: DC | PRN
  Administered 2024-05-05: 5000 [IU] via INTRAVENOUS

## 2024-05-05 MED ORDER — LIDOCAINE HCL (PF) 1 % IJ SOLN
INTRAMUSCULAR | Status: DC | PRN
  Administered 2024-05-05 (×2): 5 mL via INTRADERMAL

## 2024-05-05 MED ORDER — VERAPAMIL HCL 2.5 MG/ML IV SOLN
INTRAVENOUS | Status: DC | PRN
  Administered 2024-05-05: 10 mL via INTRA_ARTERIAL

## 2024-05-05 MED ORDER — VERAPAMIL HCL 2.5 MG/ML IV SOLN
INTRAVENOUS | Status: AC
  Filled 2024-05-05: qty 2

## 2024-05-05 NOTE — Interval H&P Note (Signed)
 History and Physical Interval Note:  05/05/2024 7:26 AM  Ethan Farrell  has presented today for surgery, with the diagnosis of aortic stenosis.  The various methods of treatment have been discussed with the patient and family. After consideration of risks, benefits and other options for treatment, the patient has consented to  Procedures: RIGHT/LEFT HEART CATH AND CORONARY ANGIOGRAPHY (N/A) as a surgical intervention.  The patient's history has been reviewed, patient examined, no change in status, stable for surgery.  I have reviewed the patient's chart and labs.  Questions were answered to the patient's satisfaction.    Cath Lab Visit (complete for each Cath Lab visit)  Clinical Evaluation Leading to the Procedure:   ACS: No.  Non-ACS:    Anginal Classification: CCS III  Anti-ischemic medical therapy: No Therapy  Non-Invasive Test Results: No non-invasive testing performed  Prior CABG: No previous CABG        Lonni Cash

## 2024-05-05 NOTE — Discharge Instructions (Signed)
 Brachial Site Care   This sheet gives you information about how to care for yourself after your procedure. Your health care provider may also give you more specific instructions. If you have problems or questions, contact your health care provider. What can I expect after the procedure? After the procedure, it is common to have: Bruising and tenderness at the catheter insertion area. Follow these instructions at home:  Insertion site care Follow instructions from your health care provider about how to take care of your insertion site. Make sure you: Wash your hands with soap and water  before you change your bandage (dressing). If soap and water  are not available, use hand sanitizer. Remove your dressing as told by your health care provider. In 24 hours Check your insertion site every day for signs of infection. Check for: Redness, swelling, or pain. Pus or a bad smell. Warmth. You may shower 24-48 hours after the procedure. Do not apply powder or lotion to the site.  Activity For 24 hours after the procedure, or as directed by your health care provider: Do not push or pull heavy objects with the affected arm. Do not drive yourself home from the hospital or clinic. You may drive 24 hours after the procedure unless your health care provider tells you not to. Do not lift anything that is heavier than 10 lb (4.5 kg), or the limit that you are told, until your health care provider says that it is safe.  For 24 hours Drink plenty of fluids for 48 hours and keep wrist elevated at heart level for 24 hours  Radial Site Care   This sheet gives you information about how to care for yourself after your procedure. Your health care provider may also give you more specific instructions. If you have problems or questions, contact your health care provider. What can I expect after the procedure? After the procedure, it is common to have: Bruising and tenderness at the catheter insertion area. Follow  these instructions at home: Medicines Take over-the-counter and prescription medicines only as told by your health care provider. Insertion site care Follow instructions from your health care provider about how to take care of your insertion site. Make sure you: Wash your hands with soap and water  before you change your bandage (dressing). If soap and water  are not available, use hand sanitizer. Remove your dressing as told by your health care provider. In 24 hours Check your insertion site every day for signs of infection. Check for: Redness, swelling, or pain. Fluid or blood. Pus or a bad smell. Warmth. Do not take baths, swim, or use a hot tub until your health care provider approves. You may shower 24-48 hours after the procedure, or as directed by your health care provider. Remove the dressing and gently wash the site with plain soap and water . Pat the area dry with a clean towel. Do not rub the site. That could cause bleeding. Do not apply powder or lotion to the site. Activity   For 24 hours after the procedure, or as directed by your health care provider: Do not flex or bend the affected arm. Do not push or pull heavy objects with the affected arm. Do not drive yourself home from the hospital or clinic. You may drive 24 hours after the procedure unless your health care provider tells you not to. Do not operate machinery or power tools. Do not lift anything that is heavier than 10 lb (4.5 kg), or the limit that you are told, until  your health care provider says that it is safe.  For 4 days Ask your health care provider when it is okay to: Return to work or school. Resume usual physical activities or sports. Resume sexual activity. General instructions If the catheter site starts to bleed, raise your arm and put firm pressure on the site. If the bleeding does not stop, get help right away. This is a medical emergency. If you went home on the same day as your procedure, a  responsible adult should be with you for the first 24 hours after you arrive home. Keep all follow-up visits as told by your health care provider. This is important. Contact a health care provider if: You have a fever. You have redness, swelling, or yellow drainage around your insertion site. Get help right away if: You have unusual pain at the radial site. The catheter insertion area swells very fast. The insertion area is bleeding, and the bleeding does not stop when you hold steady pressure on the area. Your arm or hand becomes pale, cool, tingly, or numb. These symptoms may represent a serious problem that is an emergency. Do not wait to see if the symptoms will go away. Get medical help right away. Call your local emergency services (911 in the [place] Do not drive yourself to the hospital. Summary After the procedure, it is common to have bruising and tenderness at the site. Follow instructions from your health care provider about how to take care of your radial site wound. Check the wound every day for signs of infection. Do not lift anything that is heavier than 10 lb (4.5 kg), or the limit that you are told, until your health care provider says that it is safe. This information is not intended to replace advice given to you by your health care provider. Make sure you discuss any questions you have with your health care provider. Document Revised: 04/21/2017 Document Reviewed: 04/21/2017 Elsevier Patient Education  2020 [employer].Drink plenty of fluids for 48 hours and keep wrist elevated at heart level for 24 hours  Radial Site Care   This sheet gives you information about how to care for yourself after your procedure. Your health care provider may also give you more specific instructions. If you have problems or questions, contact your health care provider. What can I expect after the procedure? After the procedure, it is common to have: Bruising and tenderness at the catheter  insertion area. Follow these instructions at home: Medicines Take over-the-counter and prescription medicines only as told by your health care provider. Insertion site care Follow instructions from your health care provider about how to take care of your insertion site. Make sure you: Wash your hands with soap and water  before you change your bandage (dressing). If soap and water  are not available, use hand sanitizer. Remove your dressing as told by your health care provider. In 24 hours Check your insertion site every day for signs of infection. Check for: Redness, swelling, or pain. Fluid or blood. Pus or a bad smell. Warmth. Do not take baths, swim, or use a hot tub until your health care provider approves. You may shower 24-48 hours after the procedure, or as directed by your health care provider. Remove the dressing and gently wash the site with plain soap and water . Pat the area dry with a clean towel. Do not rub the site. That could cause bleeding. Do not apply powder or lotion to the site. Activity   For 24 hours after  the procedure, or as directed by your health care provider: Do not flex or bend the affected arm. Do not push or pull heavy objects with the affected arm. Do not drive yourself home from the hospital or clinic. You may drive 24 hours after the procedure unless your health care provider tells you not to. Do not operate machinery or power tools. Do not lift anything that is heavier than 10 lb (4.5 kg), or the limit that you are told, until your health care provider says that it is safe.  For 4 days Ask your health care provider when it is okay to: Return to work or school. Resume usual physical activities or sports. Resume sexual activity. General instructions If the catheter site starts to bleed, raise your arm and put firm pressure on the site. If the bleeding does not stop, get help right away. This is a medical emergency. If you went home on the same day as  your procedure, a responsible adult should be with you for the first 24 hours after you arrive home. Keep all follow-up visits as told by your health care provider. This is important. Contact a health care provider if: You have a fever. You have redness, swelling, or yellow drainage around your insertion site. Get help right away if: You have unusual pain at the radial site. The catheter insertion area swells very fast. The insertion area is bleeding, and the bleeding does not stop when you hold steady pressure on the area. Your arm or hand becomes pale, cool, tingly, or numb. These symptoms may represent a serious problem that is an emergency. Do not wait to see if the symptoms will go away. Get medical help right away. Call your local emergency services (911 in the [place] Do not drive yourself to the hospital. Summary After the procedure, it is common to have bruising and tenderness at the site. Follow instructions from your health care provider about how to take care of your radial site wound. Check the wound every day for signs of infection. Do not lift anything that is heavier than 10 lb (4.5 kg), or the limit that you are told, until your health care provider says that it is safe. This information is not intended to replace advice given to you by your health care provider. Make sure you discuss any questions you have with your health care provider. Document Revised: 04/21/2017 Document Reviewed: 04/21/2017 Elsevier Patient Education  2020 [employer].

## 2024-05-05 NOTE — Progress Notes (Signed)
 Pt and friend received discharge instructions, teach back performed. Iv removed, no complications. Rt radial site is clean dry intact, site is soft, no signs of bleeding. Pt escorted out via wheelchair to friend's vehicle.

## 2024-05-08 ENCOUNTER — Encounter: Admitting: Surgery

## 2024-05-10 ENCOUNTER — Encounter: Admitting: Surgery

## 2024-05-18 ENCOUNTER — Ambulatory Visit (HOSPITAL_COMMUNITY)

## 2024-06-05 ENCOUNTER — Ambulatory Visit: Admitting: Nurse Practitioner

## 2024-07-17 ENCOUNTER — Ambulatory Visit: Admitting: Family
